# Patient Record
Sex: Female | Born: 1980 | Race: Black or African American | Hispanic: No | Marital: Married | Smoking: Never smoker
Health system: Southern US, Community
[De-identification: ages and names within clinical notes are randomized; demographics above are authoritative.]

## PROBLEM LIST (undated history)

## (undated) DIAGNOSIS — Z789 Other specified health status: Secondary | ICD-10-CM

## (undated) DIAGNOSIS — F419 Anxiety disorder, unspecified: Secondary | ICD-10-CM

## (undated) HISTORY — DX: Other specified health status: Z78.9

## (undated) HISTORY — PX: DILATION AND CURETTAGE OF UTERUS: SHX78

---

## 2001-05-20 DIAGNOSIS — O1490 Unspecified pre-eclampsia, unspecified trimester: Secondary | ICD-10-CM

## 2006-06-19 ENCOUNTER — Emergency Department: Payer: Self-pay | Admitting: Emergency Medicine

## 2007-05-14 ENCOUNTER — Emergency Department: Payer: Self-pay

## 2007-05-16 ENCOUNTER — Ambulatory Visit: Payer: Self-pay

## 2007-05-21 ENCOUNTER — Ambulatory Visit: Payer: Self-pay

## 2007-05-23 ENCOUNTER — Emergency Department: Payer: Self-pay | Admitting: Emergency Medicine

## 2007-05-24 ENCOUNTER — Emergency Department: Payer: Self-pay | Admitting: Emergency Medicine

## 2008-05-15 IMAGING — US US OB < 14 WEEKS - US OB TV
1 series · 17 of 28 positions shown · non-contrast
Comparison: none

REASON FOR EXAM: LLQ pain, 5 wk pregnant; [HOSPITAL]
COMMENTS:

[Series 1: us ob < 14 weeks - us ob tv · 17 of 47 slices shown]
[im 1/47]
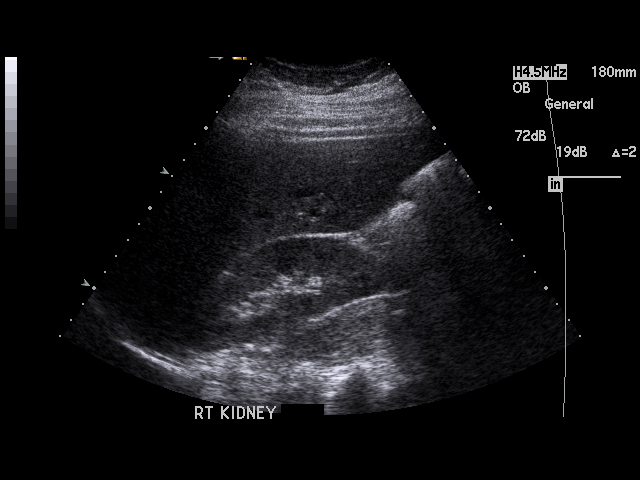
[im 4/47]
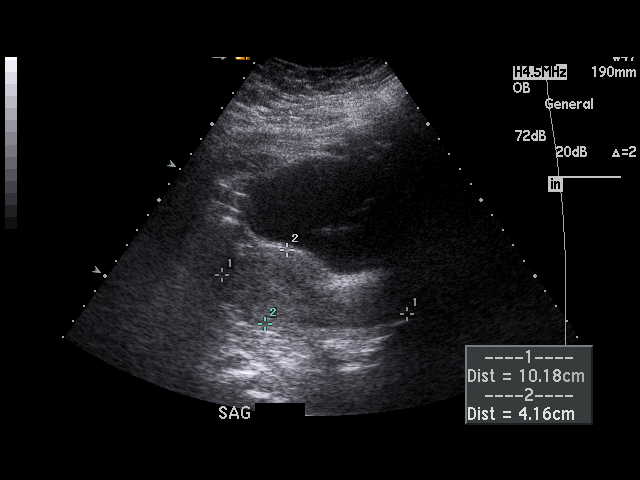
[im 7/47]
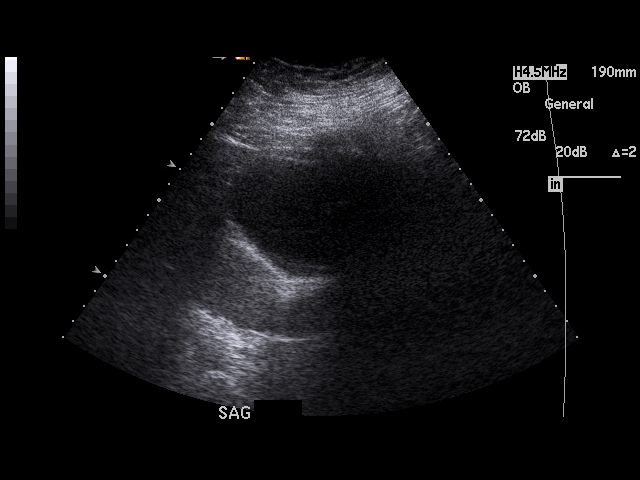
[im 9/47]
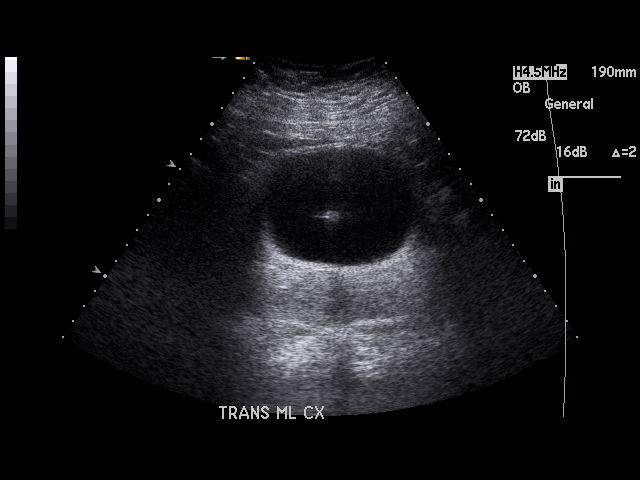
[im 12/47]
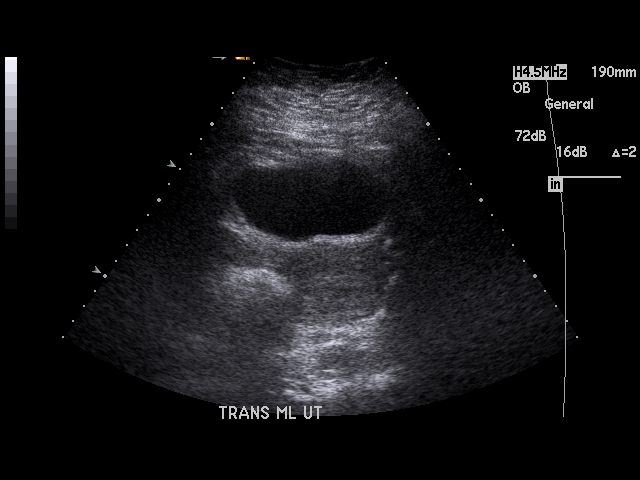
[im 16/47]
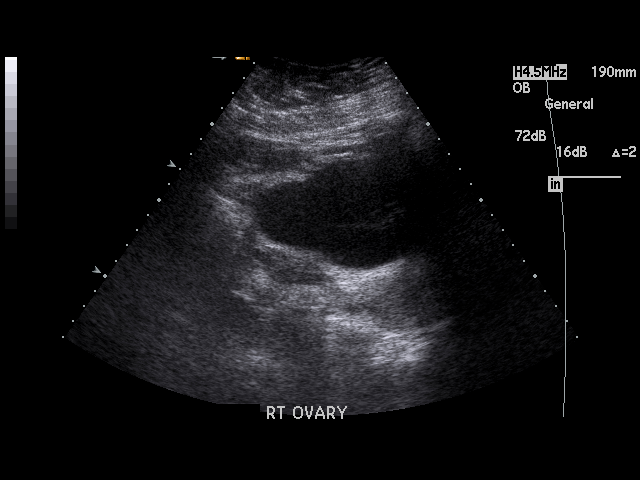
[im 18/47]
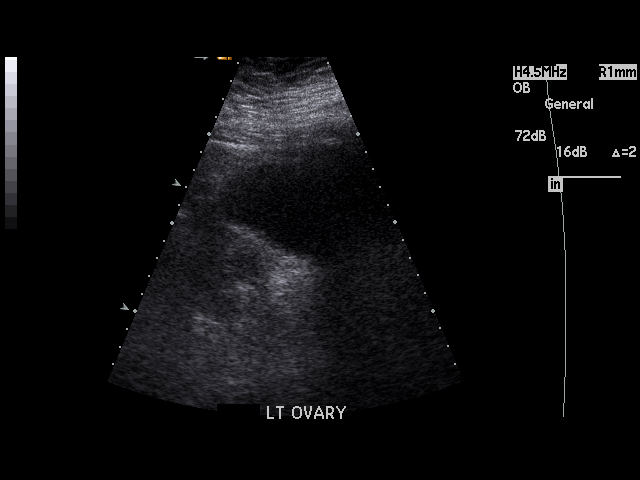
[im 21/47]
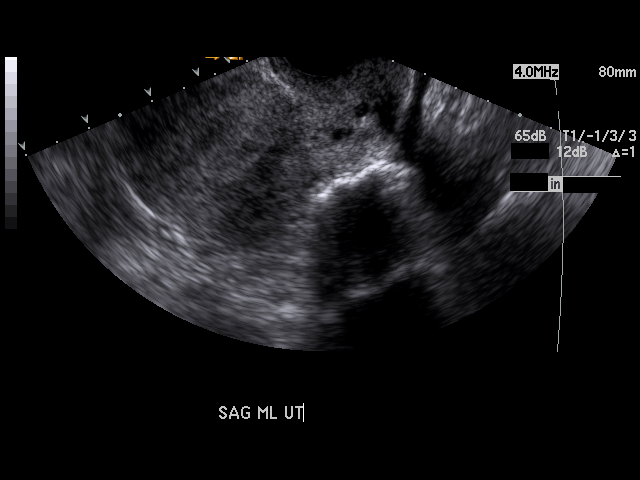
[im 24/47]
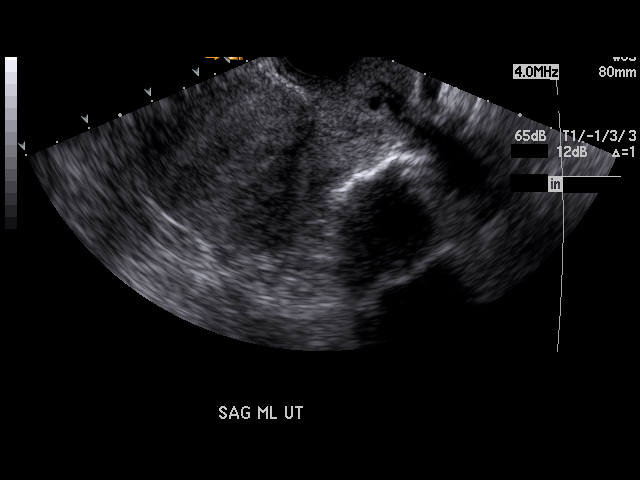
[im 26/47]
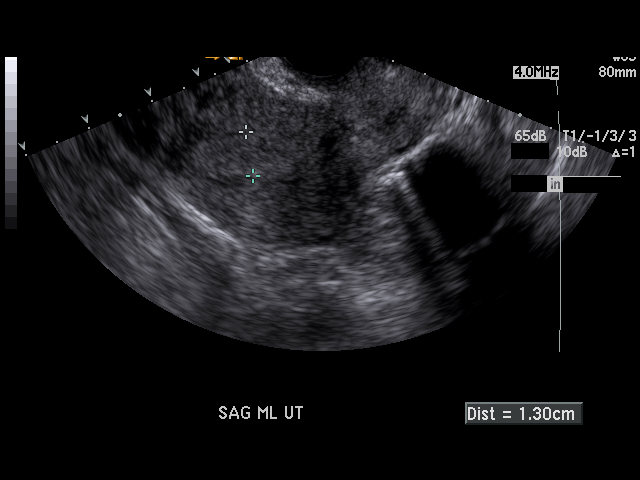
[im 29/47]
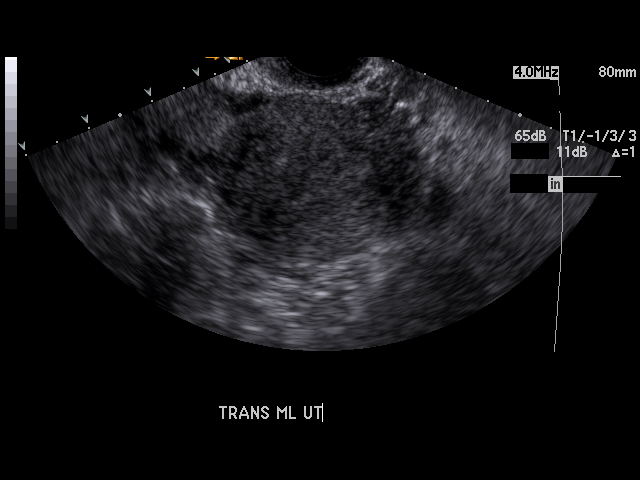
[im 31/47]
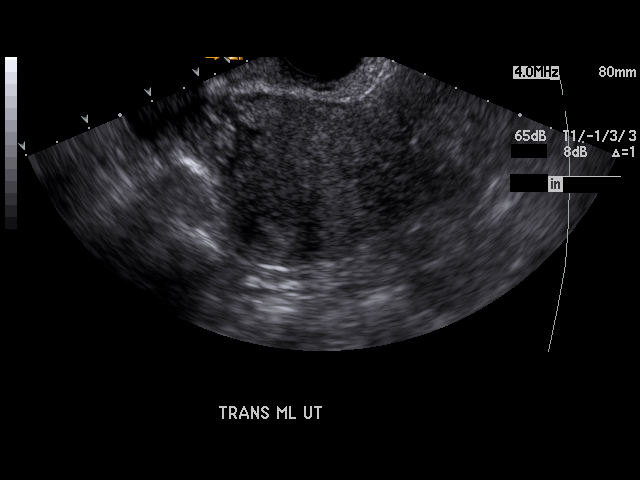
[im 35/47]
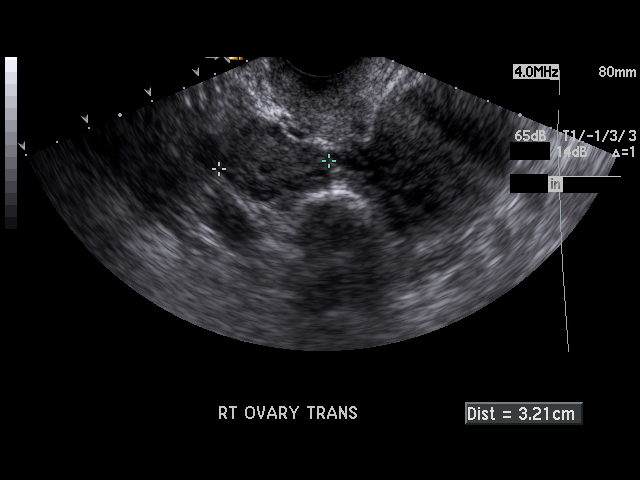
[im 38/47]
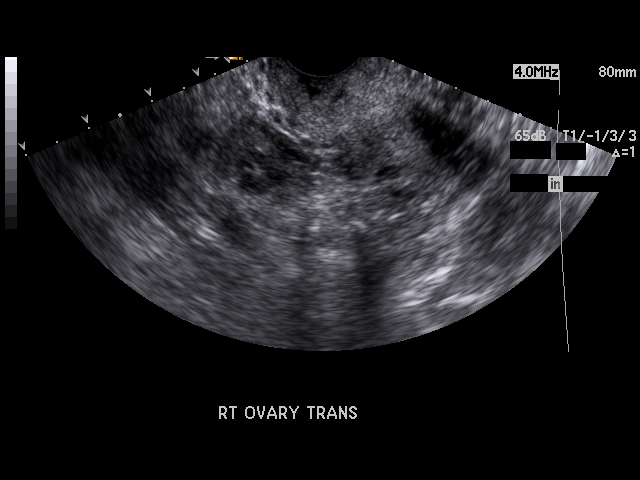
[im 40/47]
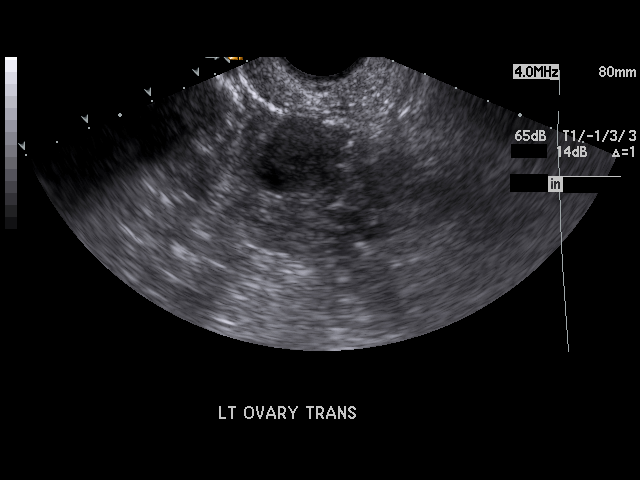
[im 43/47]
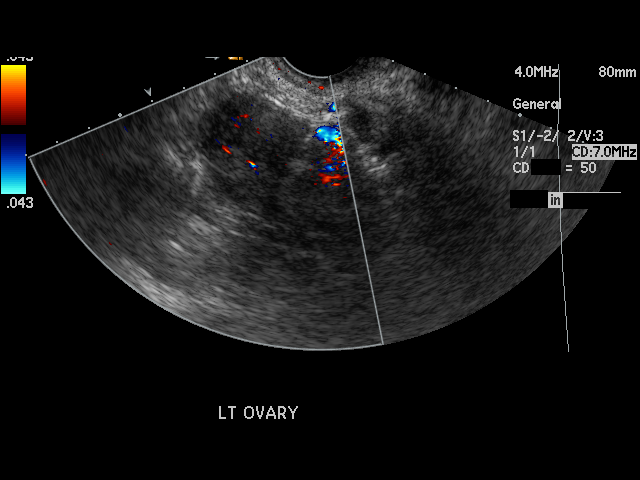
[im 47/47]
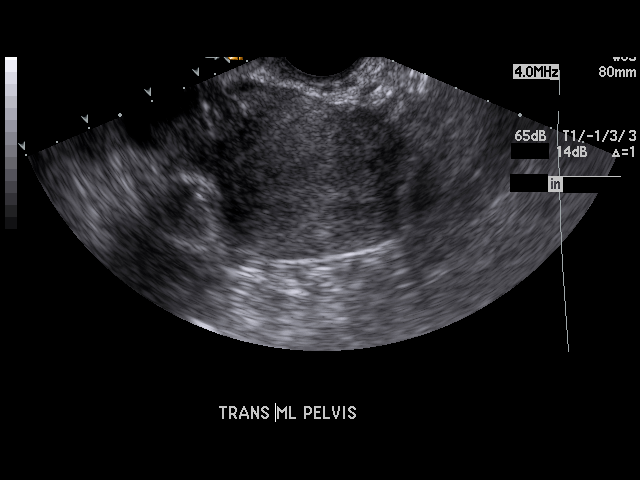

[17 of 28 positions shown; findings below may reference images not displayed]

PROCEDURE:     US  - US OB LESS THAN 14 WEEKS  - May 14, 2007 [DATE]

RESULT:      The uterus measures 10.1 cm x 4.1 m x 5.9 cm. No intrauterine
gestational sac is seen. In the pregnant patient, the finding of no
intrauterine gestation is consistent with very early pregnancy that is not
seen, recent abortion or ectopic pregnancy. Continued followup is,
therefore, recommended. No abnormal adnexal masses are seen. There is no
free fluid visualized in the pelvis.  The RIGHT and LEFT ovaries are normal
in appearance. The visualized portion of the urinary bladder shows no
significant abnormalities.
IMPRESSION: 1.  No intrauterine pregnancy is identified. The differential for this in a
pregnant patient is as stated above.
2. No abnormal adnexal masses are seen.
3. No free fluid is noted in the pelvis.

## 2008-06-16 ENCOUNTER — Emergency Department: Payer: Self-pay | Admitting: Internal Medicine

## 2008-11-25 ENCOUNTER — Emergency Department: Payer: Self-pay | Admitting: Emergency Medicine

## 2013-12-12 HISTORY — PX: KNEE ARTHROSCOPY: SUR90

## 2014-03-13 ENCOUNTER — Ambulatory Visit: Payer: Self-pay | Admitting: Specialist

## 2014-05-06 ENCOUNTER — Ambulatory Visit: Payer: Self-pay | Admitting: Specialist

## 2015-04-04 NOTE — Op Note (Signed)
PATIENT NAME:  Evelyn Wright, Evelyn Wright MR#:  [id number] DATE OF BIRTH:  09-30-1981  DATE OF PROCEDURE:  05/06/2014  PREOPERATIVE DIAGNOSIS: Lateral meniscus tear, right knee.   POSTOPERATIVE DIAGNOSIS: Lateral meniscus tear, right knee.   PROCEDURE: Arthroscopic partial lateral meniscectomy, right knee.   SURGEON: Myra Rudehristopher Danylah Holden, M.D.   ANESTHESIA: General.   COMPLICATIONS: None.   DESCRIPTION OF PROCEDURE: After adequate induction of general anesthesia, the right lower extremity is secured in the legholder in the usual manner for arthroscopy. The right leg and knee are thoroughly prepped with alcohol and ChloraPrep and draped in standard sterile fashion. The joint is infiltrated first with 10 mL of Marcaine with epinephrine. Diagnostic arthroscopy is performed. There is minimal synovitis present. The patellofemoral articulation is normal. The medial compartment is normal, including normal articular surfaces and a normal medial meniscus. The anterior cruciate ligament is normal. 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 This is associated with a complex tear of the midportion of the lateral meniscus. Using a combination of the basket forceps, the full radial resector and the ArthroWand, the torn portion of the meniscus is resected back to a stable rim. Attention is then turned to the lateral gutter where there is seen to be on the MRI a lateral meniscal cyst. The ArthroWand and the full radial resector are used to resect the meniscal ganglion cyst tissue until the lateral gutter is completely clean. The joint is thoroughly irrigated multiple times. Skin portals are closed with 5-0 nylon. The joint is infiltrated with 10 mL of Marcaine with epinephrine with 4 mg of morphine. A soft bulky dressing is applied. The patient is returned to the recovery room in satisfactory condition having tolerated the procedure quite well.     ____________________________ Clare Gandyhristopher E. Callie Bunyard, MD ces:sg D: 05/06/2014 09:58:49 ET T: 05/06/2014 10:04:42 ET JOB#: [id number]  cc: Clare Gandyhristopher E. Cherysh Epperly, MD, <Dictator> Clare GandyHRISTOPHER E Annita Ratliff MD ELECTRONICALLY SIGNED 05/17/2014 12:38

## 2015-08-27 ENCOUNTER — Ambulatory Visit
Admission: RE | Admit: 2015-08-27 | Discharge: 2015-08-27 | Disposition: A | Discharge status: Home / Self Care | Payer: 59 | Source: Ambulatory Visit | Attending: Obstetrics and Gynecology | Admitting: Obstetrics and Gynecology

## 2015-08-27 DIAGNOSIS — Z0000 Encounter for general adult medical examination without abnormal findings: Secondary | ICD-10-CM | POA: Insufficient documentation

## 2015-09-16 DIAGNOSIS — Z01818 Encounter for other preprocedural examination: Secondary | ICD-10-CM | POA: Diagnosis not present

## 2015-09-16 DIAGNOSIS — O021 Missed abortion: Secondary | ICD-10-CM | POA: Diagnosis not present

## 2015-09-16 LAB — CBC
HCT: 39.1 % (ref 35.0–47.0)
Hemoglobin: 13 g/dL (ref 12.0–16.0)
MCH: 28.2 pg (ref 26.0–34.0)
MCHC: 33.3 g/dL (ref 32.0–36.0)
MCV: 84.8 fL (ref 80.0–100.0)
PLATELETS: 232 10*3/uL (ref 150–440)
RBC: 4.61 MIL/uL (ref 3.80–5.20)
RDW: 13.2 % (ref 11.5–14.5)
WBC: 10.2 10*3/uL (ref 3.6–11.0)

## 2015-09-16 NOTE — Patient Instructions (Signed)
  Your procedure is scheduled on: 09/22/15 Report to Day Surgery. To find out your arrival time please call [phone] between 1PM - 3PM on 09/21/15.  Remember: Instructions that are not followed completely may result in serious medical risk, up to and including death, or upon the discretion of your surgeon and anesthesiologist your surgery may need to be rescheduled.    __x__ 1. Do not eat food or drink liquids after midnight. No gum chewing or hard candies.     _x___ 2. No Alcohol for 24 hours before or after surgery.   ____ 3. Bring all medications with you on the day of surgery if instructed.    __x__ 4. Notify your doctor if there is any change in your medical condition     (cold, fever, infections).     Do not wear jewelry, make-up, hairpins, clips or nail polish.  Do not wear lotions, powders, or perfumes. You may wear deodorant.  Do not shave 48 hours prior to surgery. Men may shave face and neck.  Do not bring valuables to the hospital.    [facility] is not responsible for any belongings or valuables.               Contacts, dentures or bridgework may not be worn into surgery.  Leave your suitcase in the car. After surgery it may be brought to your room.  For patients admitted to the hospital, discharge time is determined by your                treatment team.   Patients discharged the day of surgery will not be allowed to drive home.   Please read over the following fact sheets that you were given:   Surgical Site Infection Prevention   ____ Take these medicines the morning of surgery with A SIP OF WATER:    1.   2.   3.   4.  5.  6.  ____ Fleet Enema (as directed)   ____ Use CHG Soap as directed  ____ Use inhalers on the day of surgery  ____ Stop metformin 2 days prior to surgery    ____ Take 1/2 of usual insulin dose the night before surgery and none on the morning of surgery.   ____ Stop Coumadin/Plavix/aspirin on ____ Stop Anti-inflammatories on     ____ Stop supplements until after surgery.    ____ Bring C-Pap to the hospital.

## 2015-09-17 ENCOUNTER — Encounter
Admission: RE | Admit: 2015-09-17 | Discharge: 2015-09-17 | Disposition: A | Discharge status: Home / Self Care | Payer: 59 | Source: Ambulatory Visit | Attending: Obstetrics and Gynecology | Admitting: Obstetrics and Gynecology

## 2015-09-17 DIAGNOSIS — Z01818 Encounter for other preprocedural examination: Secondary | ICD-10-CM | POA: Insufficient documentation

## 2015-09-17 DIAGNOSIS — O021 Missed abortion: Secondary | ICD-10-CM | POA: Insufficient documentation

## 2015-09-17 LAB — TYPE AND SCREEN
ABO/RH(D): B POS
Antibody Screen: NEGATIVE

## 2015-09-17 LAB — ABO/RH: ABO/RH(D): B POS

## 2015-09-22 ENCOUNTER — Ambulatory Visit: Payer: Commercial Managed Care - HMO | Admitting: Anesthesiology

## 2015-09-22 ENCOUNTER — Encounter: Payer: Self-pay | Admitting: *Deleted

## 2015-09-22 ENCOUNTER — Ambulatory Visit
Admission: RE | Admit: 2015-09-22 | Discharge: 2015-09-22 | Disposition: A | Discharge status: Home / Self Care | Payer: Commercial Managed Care - HMO | Source: Ambulatory Visit | Attending: Obstetrics and Gynecology | Admitting: Obstetrics and Gynecology

## 2015-09-22 ENCOUNTER — Encounter
Admission: RE | Disposition: A | Discharge status: Home / Self Care | Payer: Self-pay | Source: Ambulatory Visit | Attending: Obstetrics and Gynecology

## 2015-09-22 DIAGNOSIS — Z8249 Family history of ischemic heart disease and other diseases of the circulatory system: Secondary | ICD-10-CM | POA: Diagnosis not present

## 2015-09-22 DIAGNOSIS — Z9889 Other specified postprocedural states: Secondary | ICD-10-CM

## 2015-09-22 DIAGNOSIS — Z8042 Family history of malignant neoplasm of prostate: Secondary | ICD-10-CM | POA: Insufficient documentation

## 2015-09-22 DIAGNOSIS — O021 Missed abortion: Secondary | ICD-10-CM | POA: Insufficient documentation

## 2015-09-22 DIAGNOSIS — Z803 Family history of malignant neoplasm of breast: Secondary | ICD-10-CM | POA: Diagnosis not present

## 2015-09-22 HISTORY — PX: DILATION AND EVACUATION: SHX1459

## 2015-09-22 LAB — TYPE AND SCREEN
ABO/RH(D): B POS
Antibody Screen: NEGATIVE

## 2015-09-22 SURGERY — DILATION AND EVACUATION, UTERUS
Anesthesia: General | Wound class: Clean Contaminated

## 2015-09-22 MED ORDER — FENTANYL CITRATE (PF) 100 MCG/2ML IJ SOLN
25.0000 ug | INTRAMUSCULAR | Status: DC | PRN
  Administered 2015-09-22 (×4): 25 ug via INTRAVENOUS

## 2015-09-22 MED ORDER — FENTANYL CITRATE (PF) 100 MCG/2ML IJ SOLN
INTRAMUSCULAR | Status: AC
  Administered 2015-09-22: 25 ug via INTRAVENOUS
  Filled 2015-09-22: qty 2

## 2015-09-22 MED ORDER — MIDAZOLAM HCL 2 MG/2ML IJ SOLN
INTRAMUSCULAR | Status: DC | PRN
  Administered 2015-09-22: 2 mg via INTRAVENOUS

## 2015-09-22 MED ORDER — ONDANSETRON HCL 4 MG/2ML IJ SOLN: 4.0000 mg | Freq: Once | INTRAMUSCULAR | Status: DC | PRN

## 2015-09-22 MED ORDER — HYDROCODONE-ACETAMINOPHEN 5-325 MG PO TABS: 1.0000 | ORAL_TABLET | Freq: Four times a day (QID) | ORAL | Status: DC | PRN

## 2015-09-22 MED ORDER — DOXYCYCLINE HYCLATE 100 MG PO TABS: 200.0000 mg | ORAL_TABLET | Freq: Once | ORAL | Status: DC

## 2015-09-22 MED ORDER — FENTANYL CITRATE (PF) 100 MCG/2ML IJ SOLN
INTRAMUSCULAR | Status: DC | PRN
  Administered 2015-09-22: 75 ug via INTRAVENOUS
  Administered 2015-09-22: 25 ug via INTRAVENOUS

## 2015-09-22 MED ORDER — LIDOCAINE HCL (CARDIAC) 20 MG/ML IV SOLN
INTRAVENOUS | Status: DC | PRN
  Administered 2015-09-22: 100 mg via INTRAVENOUS

## 2015-09-22 MED ORDER — PROPOFOL 10 MG/ML IV BOLUS
INTRAVENOUS | Status: DC | PRN
  Administered 2015-09-22: 200 mg via INTRAVENOUS

## 2015-09-22 MED ORDER — IBUPROFEN 600 MG PO TABS: 600.0000 mg | ORAL_TABLET | Freq: Four times a day (QID) | ORAL | Status: DC | PRN

## 2015-09-22 MED ORDER — DEXAMETHASONE SODIUM PHOSPHATE 4 MG/ML IJ SOLN
INTRAMUSCULAR | Status: DC | PRN
  Administered 2015-09-22: 10 mg via INTRAVENOUS

## 2015-09-22 MED ORDER — DEXTROSE 5 % IV SOLN
100.0000 mg | Freq: Once | INTRAVENOUS | Status: AC
Start: 2015-09-22 — End: 2015-09-22
  Administered 2015-09-22: 100 mg via INTRAVENOUS
  Filled 2015-09-22: qty 100

## 2015-09-22 MED ORDER — LACTATED RINGERS IV SOLN
INTRAVENOUS | Status: DC
  Administered 2015-09-22: 14:00:00 via INTRAVENOUS

## 2015-09-22 MED ORDER — FAMOTIDINE 20 MG PO TABS
ORAL_TABLET | ORAL | Status: AC
  Filled 2015-09-22: qty 1

## 2015-09-22 MED ORDER — ONDANSETRON HCL 4 MG/2ML IJ SOLN
INTRAMUSCULAR | Status: DC | PRN
  Administered 2015-09-22: 4 mg via INTRAVENOUS

## 2015-09-22 MED ORDER — FAMOTIDINE 20 MG PO TABS
20.0000 mg | ORAL_TABLET | Freq: Once | ORAL | Status: AC
  Administered 2015-09-22: 20 mg via ORAL

## 2015-09-22 SURGICAL SUPPLY — 18 items
CATH ROBINSON RED A/P 16FR (CATHETERS) ×2 IMPLANT
FILTER UTR ASPR SPEC (MISCELLANEOUS) ×1 IMPLANT
FLTR UTR ASPR SPEC (MISCELLANEOUS) ×2
GLOVE BIO SURGEON STRL SZ7 (GLOVE) ×8 IMPLANT
GOWN STRL REUS W/ TWL LRG LVL3 (GOWN DISPOSABLE) ×2 IMPLANT
GOWN STRL REUS W/TWL LRG LVL3 (GOWN DISPOSABLE) ×2
KIT BERKELEY 1ST TRIMESTER 3/8 (MISCELLANEOUS) ×2 IMPLANT
KIT RM TURNOVER CYSTO AR (KITS) ×2 IMPLANT
NS IRRIG 500ML POUR BTL (IV SOLUTION) ×2 IMPLANT
PACK DNC HYST (MISCELLANEOUS) ×2 IMPLANT
PAD OB MATERNITY 4.3X12.25 (PERSONAL CARE ITEMS) ×2 IMPLANT
PAD PREP 24X41 OB/GYN DISP (PERSONAL CARE ITEMS) ×2 IMPLANT
SET BERKELEY SUCTION TUBING (SUCTIONS) ×2 IMPLANT
TOWEL OR 17X26 4PK STRL BLUE (TOWEL DISPOSABLE) ×2 IMPLANT
VACURETTE 10 RIGID CVD (CANNULA) IMPLANT
VACURETTE 12 RIGID CVD (CANNULA) IMPLANT
VACURETTE 8 RIGID CVD (CANNULA) IMPLANT
VACURETTE 8MM F TIP (MISCELLANEOUS) ×2 IMPLANT

## 2015-09-22 NOTE — Anesthesia Preprocedure Evaluation (Addendum)
Anesthesia Evaluation  Patient identified by MRN, date of birth, ID band Patient awake    Reviewed: Allergy & Precautions, NPO status , Patient's Chart, lab work & pertinent test results  History of Anesthesia Complications Negative for: history of anesthetic complications  Airway Mallampati: II       Dental  (+) Teeth Intact   Pulmonary neg pulmonary ROS,           Cardiovascular negative cardio ROS       Neuro/Psych negative neurological ROS     GI/Hepatic negative GI ROS, Neg liver ROS,   Endo/Other  negative endocrine ROSMorbid obesity  Renal/GU negative Renal ROS     Musculoskeletal   Abdominal   Peds  Hematology   Anesthesia Other Findings   Reproductive/Obstetrics                             Anesthesia Physical Anesthesia Plan  ASA: III  Anesthesia Plan: General   Post-op Pain Management:    Induction: Intravenous  Airway Management Planned: Oral ETT  Additional Equipment:   Intra-op Plan:   Post-operative Plan:   Informed Consent: I have reviewed the patients History and Physical, chart, labs and discussed the procedure including the risks, benefits and alternatives for the proposed anesthesia with the patient or authorized representative who has indicated his/her understanding and acceptance.     Plan Discussed with:   Anesthesia Plan Comments:        Anesthesia Quick Evaluation

## 2015-09-22 NOTE — Op Note (Signed)
Patient Name: Evelyn Wright Date of Procedure: 09/22/2015  Preoperative Diagnosis: 1) 34 y.o. with 6 week missed abortion  Postoperative Diagnosis: 1) 34 y.o. with 6 week missed abortion  Operation Performed: Suction dilation and curettage  Indication: 6 week missed abortion choosing surgical management over expectant or medical management  Anesthesia: General  Primary Surgeon: Vena Austria, MD  Assistant: none  Preoperative Antibiotics:  Doxycyline  Estimated Blood Loss:  IV Fluids:  Urine Output:: ~247mL straight cath  Drains or Tubes: none  Implants: none  Specimens Removed: products of conception  Complications: none  Intraoperative Findings:  8 week size uterus sounded to 13cm, non-dilated cervix  Patient Condition: stable  Procedure in Detail:  Patient was taken to the operating room were she was administered general endotracheal anesthesia.  She was positioned in the dorsal lithotomy position utilizing Allen stirups, prepped and draped in the usual sterile fashion.  Uterus was noted to be 8 week in size, anteverted.   Prior to proceeding with the case a time out was performed.  Attention was turned to the patient's pelvis.  A red rubber catheter was used to empty the patient's bladder.  An operative speculum was placed to allow visualization of the cervix.  The anterior lip of the cervix was grasped with a single tooth tenaculum, the uterus was sounded to 13cm, and the cervix was sequentially dilated using pratt dilators.  A size 8 flexible suction curette was introduced, and several passes yielded a moderate amount of POC.  Sharp curettage was performed noting good uterine cry throughout the cavity, followed by a final pass of the size 8 flexible suction curette..    The single tooth tenaculum was removed from the cervix.  The tenaculum sites and cervix were noted to be  Hemostatic before removing the operative speculum.  Sponge needle and  instrument counts were corrects times two.  The patient tolerated the procedure well and was taken to the recovery room in stable condition.

## 2015-09-22 NOTE — H&P (Signed)
Date of Initial paper H&P: 09/16/15  History reviewed, patient examined, no change in status, stable for surgery.

## 2015-09-22 NOTE — Transfer of Care (Signed)
Immediate Anesthesia Transfer of Care Note  Patient: Evelyn Wright  Procedure(s) Performed: Procedure(s): DILATATION AND EVACUATION (N/A)  Patient Location: PACU  Anesthesia Type:General  Level of Consciousness: awake, alert , oriented and patient cooperative  Airway & Oxygen Therapy: Patient Spontanous Breathing and Patient connected to face mask oxygen  Post-op Assessment: Report given to RN, Post -op Vital signs reviewed and stable and Patient moving all extremities X 4  Post vital signs: Reviewed and stable  Last Vitals:  Filed Vitals:   09/22/15 1607  BP: 142/89  Pulse: 87  Temp: 36.6 C  Resp: 22    Complications: No apparent anesthesia complications

## 2015-09-22 NOTE — Anesthesia Postprocedure Evaluation (Signed)
  Anesthesia Post-op Note  Patient: Evelyn Wright  Procedure(s) Performed: Procedure(s): DILATATION AND EVACUATION (N/A)  Anesthesia type:General  Patient location: PACU  Post pain: Pain level controlled  Post assessment: Post-op Vital signs reviewed, Patient's Cardiovascular Status Stable, Respiratory Function Stable, Patent Airway and No signs of Nausea or vomiting  Post vital signs: Reviewed and stable  Last Vitals:  Filed Vitals:   09/22/15 1701  BP: 116/79  Pulse: 70  Temp: 36.1 C  Resp: 20    Level of consciousness: awake, alert  and patient cooperative  Complications: No apparent anesthesia complications

## 2015-09-22 NOTE — Anesthesia Procedure Notes (Signed)
Procedure Name: Intubation Date/Time: 09/22/2015 3:30 PM Performed by: Michaele Offer Pre-anesthesia Checklist: Patient identified, Emergency Drugs available, Suction available, Patient being monitored and Timeout performed Patient Re-evaluated:Patient Re-evaluated prior to inductionOxygen Delivery Method: Circle system utilized Preoxygenation: Pre-oxygenation with 100% oxygen Intubation Type: IV induction, Cricoid Pressure applied and Rapid sequence Laryngoscope Size: Mac and 3 Grade View: Grade II Tube type: Oral Tube size: 7.0 mm Number of attempts: 1 Airway Equipment and Method: Rigid stylet Placement Confirmation: ETT inserted through vocal cords under direct vision,  positive ETCO2 and breath sounds checked- equal and bilateral Secured at: 21 cm Tube secured with: Tape Dental Injury: Teeth and Oropharynx as per pre-operative assessment

## 2015-09-22 NOTE — Discharge Instructions (Signed)
General Anesthesia, Adult, Care After Refer to this sheet in the next few weeks. These instructions provide you with information on caring for yourself after your procedure. Your health care provider may also give you more specific instructions. Your treatment has been planned according to current medical practices, but problems sometimes occur. Call your health care provider if you have any problems or questions after your procedure. WHAT TO EXPECT AFTER THE PROCEDURE After the procedure, it is typical to experience:  Sleepiness.  Nausea and vomiting. HOME CARE INSTRUCTIONS  For the first 24 hours after general anesthesia:  Have a responsible person with you.  Do not drive a car. If you are alone, do not take public transportation.  Do not drink alcohol.  Do not take medicine that has not been prescribed by your health care provider.  Do not sign important papers or make important decisions.  You may resume a normal diet and activities as directed by your health care provider.  Change bandages (dressings) as directed.  If you have questions or problems that seem related to general anesthesia, call the hospital and ask for the anesthetist or anesthesiologist on call. SEEK MEDICAL CARE IF:  You have nausea and vomiting that continue the day after anesthesia.  You develop a rash. SEEK IMMEDIATE MEDICAL CARE IF:   You have difficulty breathing.  You have chest pain.  You have any allergic problems.   This information is not intended to replace advice given to you by your health care provider. Make sure you discuss any questions you have with your health care provider.   Document Released: 03/06/2001 Document Revised: 12/19/2014 Document Reviewed: 03/28/2012 Elsevier Interactive Patient Education 2016 Elsevier Inc. Dilation and Curettage or Vacuum Curettage, Care After Refer to this sheet in the next few weeks. These instructions provide you with information on caring for  yourself after your procedure. Your health care provider may also give you more specific instructions. Your treatment has been planned according to current medical practices, but problems sometimes occur. Call your health care provider if you have any problems or questions after your procedure. WHAT TO EXPECT AFTER THE PROCEDURE After your procedure, it is typical to have light cramping and bleeding. This may last for 2 days to 2 weeks after the procedure. HOME CARE INSTRUCTIONS   Do not drive for 24 hours.  Wait 1 week before returning to strenuous activities.  Take your temperature 2 times a day for 4 days and write it down. Provide these temperatures to your health care provider if you develop a fever.  Avoid long periods of standing.  Avoid heavy lifting, pushing, or pulling. Do not lift anything heavier than 10 pounds (4.5 kg).  Limit stair climbing to once or twice a day.  Take rest periods often.  You may resume your usual diet.  Drink enough fluids to keep your urine clear or pale yellow.  Your usual bowel function should return. If you have constipation, you may:  Take a mild laxative with permission from your health care provider.  Add fruit and bran to your diet.  Drink more fluids.  Take showers instead of baths until your health care provider gives you permission to take baths.  Do not go swimming or use a hot tub until your health care provider approves.  Try to have someone with you or available to you the first 24-48 hours, especially if you were given a general anesthetic.  Do not douche, use tampons, or have sex (intercourse) for  2 weeks after the procedure.  Only take over-the-counter or prescription medicines as directed by your health care provider. Do not take aspirin. It can cause bleeding.  Follow up with your health care provider as directed. SEEK MEDICAL CARE IF:   You have increasing cramps or pain that is not relieved with medicine.  You have  abdominal pain that does not seem to be related to the same area of earlier cramping and pain.  You have bad smelling vaginal discharge.  You have a rash.  You are having problems with any medicine. SEEK IMMEDIATE MEDICAL CARE IF:   You have bleeding that is heavier than a normal menstrual period.  You have a fever.  You have chest pain.  You have shortness of breath.  You feel dizzy or feel like fainting.  You pass out.  You have pain in your shoulder strap area.  You have heavy vaginal bleeding with or without blood clots. MAKE SURE YOU:   Understand these instructions.  Will watch your condition.  Will get help right away if you are not doing well or get worse.   This information is not intended to replace advice given to you by your health care provider. Make sure you discuss any questions you have with your health care provider.   Document Released: 11/25/2000 Document Revised: 12/03/2013 Document Reviewed: 06/27/2013 Elsevier Interactive Patient Education [employer]

## 2015-09-23 ENCOUNTER — Encounter: Payer: Self-pay | Admitting: Obstetrics and Gynecology

## 2015-09-24 LAB — SURGICAL PATHOLOGY

## 2017-03-30 ENCOUNTER — Telehealth: Payer: Self-pay

## 2017-03-30 NOTE — Telephone Encounter (Signed)
Lm with pt to give it a little more time to see how things go. Body is still probably trying to get used to new OCP. Pt to let us know if it doesn't get better within a month

## 2017-03-30 NOTE — Telephone Encounter (Signed)
Pt calling c/o bleeding x2wks, not a flow, just c wiping.  She is on wk 8 of generic of seasonique bcp.  Bleeding hasn't stopped or lightened.  [phone]

## 2017-03-31 ENCOUNTER — Telehealth: Payer: Self-pay

## 2017-03-31 NOTE — Telephone Encounter (Signed)
Pt aware.

## 2017-03-31 NOTE — Telephone Encounter (Signed)
Pt called stating she missed call earlier today.  Please call back to address menstrual cycle issue.  [phone]

## 2017-09-20 ENCOUNTER — Ambulatory Visit (INDEPENDENT_AMBULATORY_CARE_PROVIDER_SITE_OTHER): Payer: BLUE CROSS/BLUE SHIELD | Admitting: Obstetrics and Gynecology

## 2017-09-20 ENCOUNTER — Encounter: Payer: Self-pay | Admitting: Obstetrics and Gynecology

## 2017-09-20 VITALS — BP 122/70 | HR 84 | Ht 64.0 in | Wt 260.0 lb

## 2017-09-20 DIAGNOSIS — Z98891 History of uterine scar from previous surgery: Secondary | ICD-10-CM | POA: Diagnosis not present

## 2017-09-20 DIAGNOSIS — Z6841 Body Mass Index (BMI) 40.0 and over, adult: Secondary | ICD-10-CM | POA: Insufficient documentation

## 2017-09-20 DIAGNOSIS — Z3169 Encounter for other general counseling and advice on procreation: Secondary | ICD-10-CM

## 2017-09-20 NOTE — Progress Notes (Signed)
[facility] Office Visit   Chief Complaint: Preconception Consult  History of Present Illness: Patient is a 36 y.o. [id number] interested in pursuing pregnancy in the near future.  She reports regular menstrual Wright, Evelyn Wright prior uncomplicated chlamydia infection.  The patient is current on her vaccinations.  She has had chickenpox.  Family history Wright the patient Evelyn her partner's family were reviewed.  There is no family history of genetic diseases , specifically Alfonzo Feller disease, spinal muscular atrophy, muscular dystrophy, skeletal dysplasias, cystic fibrosis Evelyn sickle cell disease.  There is family history of birth defects , specifically (husband had surgery as child Wright hole in his heart) Evelyn cardiac defects.  There is no [place] Ashkenazi jewish, middle [place] Evelyn [place] ancestry.  There is no family history of mental retardation, fragile X, autism Evelyn or premature ovarian failure.     Thyroid: some cold intolerance, no constipation or diarrhea, some hair loss Evelyn brittle hair PL: no headaches, vision changes, or nipple discharge PCOS: weight stable, no hirsutism, no acne, no amenorrhea (on OCP)  Significant other is healthy with no active medical problems  Review of Systems: Review of Systems  Constitutional: Negative Wright weight loss.  Eyes: Negative Wright blurred vision Evelyn double vision.  Cardiovascular: Negative Wright palpitations.  Gastrointestinal: Negative Wright abdominal pain.  Endo/Heme/Allergies: Negative Wright polydipsia.    Past Medical History:  History reviewed. No pertinent past medical history.  Past Surgical History:  Past Surgical History:  Procedure Laterality Date  . CESAREAN SECTION    . DILATION Evelyn CURETTAGE OF UTERUS    . DILATION Evelyn  EVACUATION N/A 09/22/2015   Procedure: DILATATION Evelyn EVACUATION;  Surgeon: Vena Austria, MD;  Location: [facility] ORS;  Service: Gynecology;  Laterality: N/A;  . KNEE ARTHROSCOPY Right    Gynecologic History: Suction D&C 10/23/2015, Pap 01/19/16 NIL HPV negative GC/CT -/-  Obstetric History: [id number]  Family History:  History reviewed. No pertinent family history.  Social History:  Social History   Social History  . Marital status: Divorced    Spouse name: N/A  . Number of children: N/A  . Years of education: N/A   Occupational History  . Not on file.   Social History Main Topics  . Smoking status: Never Smoker  . Smokeless tobacco: Never Used  . Alcohol use No  . Drug use: No  . Sexual activity: Yes    Birth control/ protection: Pill   Other Topics Concern  . Not on file   Social History Narrative  . No narrative on file    Allergies:  No Known Allergies  Medications: Prior to Admission medications   Medication Sig Start Date End Date Taking? Authorizing Provider  Levonorgestrel-Ethinyl Estradiol (AMETHIA,CAMRESE) 0.15-0.03 &0.01 MG tablet Take 1 tablet by mouth daily. 07/17/17  Yes [provider]    Physical Exam Blood pressure 122/70, pulse 84, height  (1.626 m), weight 260 lb (117.9 kg). Body mass index is 44.63 kg/m.  General: NAD HEENT: normocephalic, anicteric Pulmonary: No increased work of breathing Neurologic: Grossly intact Psychiatric: mood appropriate, affect full  Female chaperone present Wright pelvic Evelyn breast  portions of the physical exam  Assessment: 36 y.o. [id number] presenting Wright preconception counseling  Plan: Problem List Items Addressed This Visit      Other   History  of cesarean section   Class 3 severe obesity without serious comorbidity with body mass index (BMI) of 40.0 to 44.9 in adult [facility])    Other Visit Diagnoses    Encounter Wright preconception consultation    -  Primary      1) The patient was instructed  to start prenatal vitamins at least one month prior to actively trying to conceive.  The role Evelyn rational of prenatal vitamins in preventing neural tube defects were discussed.  2) Immunizations are up to date  3) Family history reviewed.  Preconception genetic testing Evelyn or counseling was not offered at today's visit based on review of personal Evelyn family history. - Family history of breast cancer with MyRisk negative Evelyn lifetime TC risk of 14.3% August 2016  4) Obesity - we discussed that given BMI the patient is at risk Wright PCOS Evelyn anovulation once cessation of OCP.  Recommended that patient start an oral PNV 1 month prior to cessation of OCP.  If menstrual cycle does not occur at regular intervals with discuss further work up of anovulation Evelyn briefly delved into the available treatments.  5) Advanced maternal age - we discussed AMA status if conceives, data Evelyn implications regarding miscarriage, trisomy risk, Evelyn available testing once pregnancy achieved.  We also discussed her AMA status as well as morbid obesity increase her risk Wright complications such as GDM Evelyn hypertensive disorders of pregnancy  6) A total of 15 minutes were spent in face-to-face contact with the patient during this encounter with over half of that time devoted to counseling Evelyn coordination of care.

## 2018-03-18 ENCOUNTER — Emergency Department
Admission: EM | Admit: 2018-03-18 | Discharge: 2018-03-18 | Disposition: A | Discharge status: Home / Self Care | Payer: BLUE CROSS/BLUE SHIELD | Attending: Emergency Medicine | Admitting: Emergency Medicine

## 2018-03-18 ENCOUNTER — Encounter: Payer: Self-pay | Admitting: Emergency Medicine

## 2018-03-18 DIAGNOSIS — Y9384 Activity, sleeping: Secondary | ICD-10-CM | POA: Diagnosis not present

## 2018-03-18 DIAGNOSIS — Z79899 Other long term (current) drug therapy: Secondary | ICD-10-CM | POA: Diagnosis not present

## 2018-03-18 DIAGNOSIS — Y929 Unspecified place or not applicable: Secondary | ICD-10-CM | POA: Insufficient documentation

## 2018-03-18 DIAGNOSIS — Y999 Unspecified external cause status: Secondary | ICD-10-CM | POA: Diagnosis not present

## 2018-03-18 DIAGNOSIS — Y33XXXA Other specified events, undetermined intent, initial encounter: Secondary | ICD-10-CM | POA: Insufficient documentation

## 2018-03-18 DIAGNOSIS — T161XXA Foreign body in right ear, initial encounter: Secondary | ICD-10-CM | POA: Insufficient documentation

## 2018-03-18 MED ORDER — DOCUSATE SODIUM 50 MG/5ML PO LIQD
50.0000 mg | Freq: Once | ORAL | Status: AC
  Administered 2018-03-18: 50 mg via ORAL
  Filled 2018-03-18: qty 10

## 2018-03-18 NOTE — ED Provider Notes (Signed)
[facility] Emergency Department Provider Note  ____________________________________________   First MD Initiated Contact with Patient 03/18/18 [phone]     (approximate)  I have reviewed the triage vital signs and the nursing notes.   HISTORY  Chief Complaint Foreign Body in Ear   HPI Evelyn Wright is a 37 y.o. female is here with complaint of an insect in her right ear.  Patient states that she was sleeping when she felt something crawling in her ear.  She has not put anything in her ear.  She denies any pain at this time.  History reviewed. No pertinent past medical history.  Patient Active Problem List   Diagnosis Date Noted  . History of cesarean section 09/20/2017  . Class 3 severe obesity without serious comorbidity with body mass index (BMI) of 40.0 to 44.9 in adult [facility](HCC) 09/20/2017    Past Surgical History:  Procedure Laterality Date  . CESAREAN SECTION    . DILATION AND CURETTAGE OF UTERUS    . DILATION AND EVACUATION N/A 09/22/2015   Procedure: DILATATION AND EVACUATION;  Surgeon: Vena AustriaAndreas Staebler, MD;  Location: [facility] ORS;  Service: Gynecology;  Laterality: N/A;  . KNEE ARTHROSCOPY Right     Prior to Admission medications   Medication Sig Start Date End Date Taking? Authorizing Provider  Levonorgestrel-Ethinyl Estradiol (AMETHIA,CAMRESE) 0.15-0.03 &0.01 MG tablet Take 1 tablet by mouth daily. 07/17/17   [provider]    Allergies Patient has no known allergies.  No family history on file.  Social History Social History   Tobacco Use  . Smoking status: Never Smoker  . Smokeless tobacco: Never Used  Substance Use Topics  . Alcohol use: No  . Drug use: No    Review of Systems Constitutional: No fever/chills Eyes: No visual changes. ENT: Positive foreign body right ear. Cardiovascular: Denies chest pain. Respiratory: Denies shortness of breath. Gastrointestinal:   No nausea, no vomiting.  Skin: Negative for  rash. Neurological: Negative for headaches, focal weakness or numbness. ____________________________________________   PHYSICAL EXAM:  VITAL SIGNS: ED Triage Vitals  Enc Vitals Group     BP 03/18/18 0522 118/63     Pulse Rate 03/18/18 0522 62     Resp 03/18/18 0522 18     Temp 03/18/18 0522 97.9 F (36.6 C)     Temp Source 03/18/18 0522 Oral     SpO2 03/18/18 0522 100 %     Weight 03/18/18 0523 270 lb (122.5 kg)     Height 03/18/18 0523 5' 4.5" (1.638 m)     Head Circumference --      Peak Flow --      Pain Score 03/18/18 0523 0     Pain Loc --      Pain Edu? --      Excl. in GC? --    Constitutional: Alert and oriented. Well appearing and in no acute distress. Eyes: Conjunctive are clear.  PERRL. EOMI. Head: Atraumatic. Nose: No congestion/rhinnorhea.  Left EAC and TM are clear.  Right EAC with minimal amount of cerumen and small insect noted. Mouth/Throat: Mucous membranes are moist.  Oropharynx non-erythematous. Neck: No stridor.   Cardiovascular: Normal rate, regular rhythm. Grossly normal heart sounds.  Good peripheral circulation. Respiratory: Normal respiratory effort.  No retractions. Lungs CTAB. Musculoskeletal: Moves upper and lower extremities without any difficulty.  Normal gait was noted. Neurologic:  Normal speech and language. No gross focal neurologic deficits are appreciated. Skin:  Skin is warm, dry and intact.  No rash noted. Psychiatric: Mood and affect are normal. Speech and behavior are normal.  ____________________________________________   LABS (all labs ordered are listed, but only abnormal results are displayed)  Labs Reviewed - No data to display   PROCEDURES  Procedure(s) performed: None  Procedures  Critical Care performed: No  ____________________________________________   INITIAL IMPRESSION / ASSESSMENT AND PLAN / ED COURSE  Patient presents with insect to her right ear canal.  Colace suspension was placed in the ear which was  able to smother the insect and also loosen the cerumen.  This was lavaged until clear.  Patient was discharged with instruction to follow-up with [facility] if any continued problems. ____________________________________________   FINAL CLINICAL IMPRESSION(S) / ED DIAGNOSES  Final diagnoses:  Acute foreign body of right ear canal, initial encounter     ED Discharge Orders    None       Note:  This document was prepared using Dragon voice recognition software and may include unintentional dictation errors.    Tommi Rumps, PA-C 03/18/18 1112    Sharyn Creamer, MD 03/18/18 [phone]

## 2018-03-18 NOTE — ED Notes (Signed)
Additional med placed in right ear  Ear has been irrigated with additional water  W/o success

## 2018-03-18 NOTE — ED Notes (Signed)
See triage note  Presents with possible bug in right ear this am  Denies any any pain    But feels something move  Afebrile on arrival

## 2018-03-18 NOTE — ED Triage Notes (Signed)
Patient states that a bug crawled into her right ear about 15 minutes ago.

## 2018-03-18 NOTE — Discharge Instructions (Addendum)
Tylenol if needed for ear pain.  Follow-up with Dr. Jenne CampusMcQueen at [facility] if any continued problems with your ear.

## 2018-03-18 NOTE — ED Notes (Signed)
Right ear irrigated with 10 mls of water   W/o success

## 2018-04-04 ENCOUNTER — Encounter: Payer: Self-pay | Admitting: Obstetrics and Gynecology

## 2018-04-04 ENCOUNTER — Ambulatory Visit (INDEPENDENT_AMBULATORY_CARE_PROVIDER_SITE_OTHER): Payer: BLUE CROSS/BLUE SHIELD | Admitting: Obstetrics and Gynecology

## 2018-04-04 VITALS — BP 128/88

## 2018-04-04 DIAGNOSIS — O99211 Obesity complicating pregnancy, first trimester: Secondary | ICD-10-CM

## 2018-04-04 DIAGNOSIS — O99210 Obesity complicating pregnancy, unspecified trimester: Secondary | ICD-10-CM | POA: Insufficient documentation

## 2018-04-04 DIAGNOSIS — Z98891 History of uterine scar from previous surgery: Secondary | ICD-10-CM

## 2018-04-04 DIAGNOSIS — O09529 Supervision of elderly multigravida, unspecified trimester: Secondary | ICD-10-CM | POA: Insufficient documentation

## 2018-04-04 DIAGNOSIS — Z6841 Body Mass Index (BMI) 40.0 and over, adult: Secondary | ICD-10-CM

## 2018-04-04 DIAGNOSIS — O2621 Pregnancy care for patient with recurrent pregnancy loss, first trimester: Secondary | ICD-10-CM

## 2018-04-04 DIAGNOSIS — O209 Hemorrhage in early pregnancy, unspecified: Secondary | ICD-10-CM

## 2018-04-04 DIAGNOSIS — O0990 Supervision of high risk pregnancy, unspecified, unspecified trimester: Secondary | ICD-10-CM

## 2018-04-04 DIAGNOSIS — O2620 Pregnancy care for patient with recurrent pregnancy loss, unspecified trimester: Secondary | ICD-10-CM | POA: Insufficient documentation

## 2018-04-04 DIAGNOSIS — N926 Irregular menstruation, unspecified: Secondary | ICD-10-CM | POA: Diagnosis not present

## 2018-04-04 DIAGNOSIS — Z124 Encounter for screening for malignant neoplasm of cervix: Secondary | ICD-10-CM

## 2018-04-04 DIAGNOSIS — O09521 Supervision of elderly multigravida, first trimester: Secondary | ICD-10-CM

## 2018-04-04 DIAGNOSIS — Z131 Encounter for screening for diabetes mellitus: Secondary | ICD-10-CM

## 2018-04-04 DIAGNOSIS — Z113 Encounter for screening for infections with a predominantly sexual mode of transmission: Secondary | ICD-10-CM

## 2018-04-04 LAB — POCT URINE PREGNANCY: Preg Test, Ur: POSITIVE — AB

## 2018-04-04 NOTE — Progress Notes (Signed)
New Obstetric Patient H&P   Chief Complaint: "Desires prenatal care"  History of Present Illness: Patient is a 37 y.o. [id number]  Not Hispanic or Latino female, sure LMP 03/08/18 presents with amenorrhea and positive home pregnancy test. Based on her  LMP, her EDD is Estimated Date of Delivery: 12/13/18 and her EGA is [redacted]w[redacted]d. Cycles are 5. days, regular, and occur approximately every : 26 days. Her last pap smear was an unknown amount of time. She believes the result was normal.   She had a urine pregnancy test which was positive 2 day(s)  ago. Her last menstrual period was normal and lasted for  5 day(s). Since her LMP she claims she has experienced no issues. She had some vaginal bleeding Monday and yesterday.  It was light-to-bright pink and yesterday there was brown-ish bleeding and today nothing. The bleeding was only ever noted when she wiped. Her past medical history is noncontributory. Her prior pregnancies are notable for pre-eclampsia. She has had two elective abortions and two spontaneous abortions. The father of all the other children is different than the current.  Since her LMP, she admits to the use of tobacco products  no She claims she has gained Zero pounds since the start of her pregnancy.  There are cats in the home in the home  no  She admits close contact with children on a regular basis  no  She has had chicken pox in the past unknown She has had Tuberculosis exposures, symptoms, or previously tested positive for TB   no Current or past history of domestic violence. no  Genetic Screening/Teratology Counseling: (Includes patient, baby's father, or anyone in either family with:)   1. Patient's age >/= 53 at [facility]  yes 2. Thalassemia ([place], [place], Mediterranean, or Asian background): MCV<80  no 3. Neural tube defect (meningomyelocele, spina bifida, anencephaly)  no 4. Congenital heart defect  Yes, FOB had hole in his heart, that had to be closed surgically.  Her other son had an  opening in his heart that closed on its own. The FOB is different.  5. Down syndrome  no 6. Tay-Sachs (Jewish, [place]  no 7. Canavan's Disease  no 8. Sickle cell disease or trait (African)  no  9. Hemophilia or other blood disorders  no  10. Muscular dystrophy  no  11. Cystic fibrosis  no  12. Huntington's Chorea  no  13. Mental retardation/autism  no 14. Other inherited genetic or chromosomal disorder  no 15. Maternal metabolic disorder (DM, PKU, etc)  no 16. Patient or FOB with a child with a birth defect not listed above no  16a. Patient or FOB with a birth defect themselves yes, as noted above with FOB 17. Recurrent pregnancy loss, or stillbirth  yes  18. Any medications since LMP other than prenatal vitamins (include vitamins, supplements, OTC meds, drugs, alcohol)  no 19. Any other genetic/environmental exposure to discuss  no  Infection History:   1. Lives with someone with TB or TB exposed  no  2. Patient or partner has history of genital herpes  no 3. Rash or viral illness since LMP  no 4. History of STI (GC, CT, HPV, syphilis, HIV)  Yes, a few years and it was treated. 5. History of recent travel :  no  Other pertinent information:  no    Review of Systems: Review of Systems  Constitutional: Negative.   HENT: Negative.   Eyes: Negative.   Respiratory: Negative.   Cardiovascular: Negative.  Gastrointestinal: Negative.   Genitourinary: Negative.   Musculoskeletal: Negative.   Skin: Negative.   Neurological: Negative.   Psychiatric/Behavioral: Negative.      Past Medical History: denies  Past Surgical History:  Procedure Laterality Date  . CESAREAN SECTION    . DILATION AND CURETTAGE OF UTERUS    . DILATION AND EVACUATION N/A 09/22/2015   Procedure: DILATATION AND EVACUATION;  Surgeon: Vena Austria, MD;  Location: [facility] ORS;  Service: Gynecology;  Laterality: N/A;  . KNEE ARTHROSCOPY Right    Gynecologic History: Patient's last menstrual  period was 03/08/2018 (exact date).  Obstetric History: [id number] G1: LTCS at term, complicated by preeclampsia G2: EAB G3: EAB G4: early SAB G5: early SAB G6 current  Family History: maternal grandfather with prostate cancer, maternal aunt with breast cancer, sister has epilepsy.   Social History   Socioeconomic History  . Marital status: Divorced    Spouse name: Not on file  . Number of children: Not on file  . Years of education: Not on file  . Highest education level: Not on file  Occupational History  . Not on file  Social Needs  . Financial resource strain: Not on file  . Food insecurity:    Worry: Not on file    Inability: Not on file  . Transportation needs:    Medical: Not on file    Non-medical: Not on file  Tobacco Use  . Smoking status: Never Smoker  . Smokeless tobacco: Never Used  Substance and Sexual Activity  . Alcohol use: No  . Drug use: No  . Sexual activity: Yes    Birth control/protection: None  Lifestyle  . Physical activity:    Days per week: Not on file    Minutes per session: Not on file  . Stress: Not on file  Relationships  . Social connections:    Talks on phone: Not on file    Gets together: Not on file    Attends religious service: Not on file    Active member of club or organization: Not on file    Attends meetings of clubs or organizations: Not on file    Relationship status: Not on file  . Intimate partner violence:    Fear of current or ex partner: Not on file    Emotionally abused: Not on file    Physically abused: Not on file    Forced sexual activity: Not on file  Other Topics Concern  . Not on file  Social History Narrative  . Not on file   Allergies: No Known Allergies  Prior to Admission medications   Medication Sig Start Date End Date Taking? Authorizing Provider  Prenatal Vit-Fe Fumarate-FA (PRENATAL VITAMIN PO) Take by mouth.   Yes [provider]    Physical Exam BP 128/88   LMP 03/08/2018 (Exact  Date)   Physical Exam  Constitutional: She is oriented to person, place, and time. She appears well-developed and well-nourished. No distress.  HENT:  Head: Normocephalic and atraumatic.  Eyes: Conjunctivae are normal.  Neck: Normal range of motion. Neck supple. No thyromegaly present.  Cardiovascular: Normal rate, regular rhythm and normal heart sounds. Exam reveals no gallop and no friction rub.  No murmur heard. Pulmonary/Chest: Effort normal and breath sounds normal. She has no wheezes.  Abdominal: Soft. She exhibits no distension and no mass. There is no tenderness. There is no rebound and no guarding. No hernia. Hernia confirmed negative in the right inguinal area and confirmed negative in the  left inguinal area.  Genitourinary: Pelvic exam was performed with patient supine. There is no rash, tenderness or lesion on the right labia. There is no rash, tenderness or lesion on the left labia.  Musculoskeletal: Normal range of motion.  Lymphadenopathy:       Right: No inguinal adenopathy present.       Left: No inguinal adenopathy present.  Neurological: She is alert and oriented to person, place, and time.  Skin: Skin is warm and dry. No rash noted.  Psychiatric: She has a normal mood and affect. Her behavior is normal.    Female Chaperone present during breast and/or pelvic exam.  Assessment: 37 y.o. [id number] at [redacted]w[redacted]d presenting to initiate prenatal care  Plan: 1) Avoid alcoholic beverages. 2) Patient encouraged not to smoke.  3) Discontinue the use of all non-medicinal drugs and chemicals.  4) Take prenatal vitamins daily.  5) Nutrition, food safety (fish, cheese advisories, and high nitrite foods) and exercise discussed. 6) Hospital and practice style discussed with cross coverage system.  7) Genetic Screening, such as with 1st Trimester Screening, cell free fetal DNA, AFP testing, and Ultrasound, as well as with amniocentesis and CVS as appropriate, is discussed with patient. At  the conclusion of today's visit patient undecided genetic testing 8) Patient is asked about travel to areas at risk for the [place] virus, and counseled to avoid travel and exposure to mosquitoes or sexual partners who may have themselves been exposed to the virus. Testing is discussed, and will be ordered as appropriate.  9) first trimester bleeding: will obtain quant hCG. Light positive on UPT today.  Will trend early on, given her history of SABs. Ultrasound at appropriate gestational age if hCG trending normally. 10) FOB with heart defect at birth. Consideration for ECHO for fetus at about 22 weeks. 11) obesity: will need to follow > 40 BMI protocol (early 1h gtt, recommend genetic screening testing, APT in 3rd trimester, anesthesia consult per our hospital anesthesia request. 12) history c-section: will discuss TOLAC vs repeat at a later date.   Thomasene MohairStephen Sonya Pucci, MD 04/04/2018 9:52 AM

## 2018-04-05 ENCOUNTER — Encounter: Payer: Self-pay | Admitting: Obstetrics and Gynecology

## 2018-04-06 ENCOUNTER — Other Ambulatory Visit: Payer: BLUE CROSS/BLUE SHIELD

## 2018-04-06 ENCOUNTER — Other Ambulatory Visit: Payer: Self-pay | Admitting: Obstetrics and Gynecology

## 2018-04-06 DIAGNOSIS — O209 Hemorrhage in early pregnancy, unspecified: Secondary | ICD-10-CM

## 2018-04-06 DIAGNOSIS — O2621 Pregnancy care for patient with recurrent pregnancy loss, first trimester: Secondary | ICD-10-CM

## 2018-04-06 LAB — URINE DRUG PANEL 7
AMPHETAMINES, URINE: NEGATIVE ng/mL
BENZODIAZEPINE QUANT UR: NEGATIVE ng/mL
Barbiturate Quant, Ur: NEGATIVE ng/mL
Cannabinoid Quant, Ur: NEGATIVE ng/mL
Cocaine (Metab.): NEGATIVE ng/mL
Opiate Quant, Ur: NEGATIVE ng/mL
PCP Quant, Ur: NEGATIVE ng/mL

## 2018-04-06 LAB — URINE CULTURE: ORGANISM ID, BACTERIA: NO GROWTH

## 2018-04-07 LAB — IGP,CTNG,APTIMAHPV,RFX16/18,45
Chlamydia, Nuc. Acid Amp: NEGATIVE
Gonococcus by Nucleic Acid Amp: NEGATIVE
HPV APTIMA: NEGATIVE
PAP SMEAR COMMENT: 0

## 2018-04-07 LAB — BETA HCG QUANT (REF LAB): hCG Quant: 354 m[IU]/mL

## 2018-04-09 LAB — RPR+RH+ABO+RUB AB+AB SCR+CB...
Antibody Screen: NEGATIVE
HIV Screen 4th Generation wRfx: NONREACTIVE
Hematocrit: 42 % (ref 34.0–46.6)
Hemoglobin: 13.7 g/dL (ref 11.1–15.9)
Hepatitis B Surface Ag: NEGATIVE
MCH: 28.2 pg (ref 26.6–33.0)
MCHC: 32.6 g/dL (ref 31.5–35.7)
MCV: 87 fL (ref 79–97)
PLATELETS: 279 10*3/uL (ref 150–379)
RBC: 4.85 x10E6/uL (ref 3.77–5.28)
RDW: 13.6 % (ref 12.3–15.4)
RPR: NONREACTIVE
RUBELLA: 1.98 {index} (ref >0.99)
Rh Factor: POSITIVE
VARICELLA: 151 {index} — AB (ref >165)
WBC: 7.1 10*3/uL (ref 3.4–10.8)

## 2018-04-09 LAB — HEMOGLOBINOPATHY EVALUATION
HGB C: 0 %
HGB S: 0 %
HGB VARIANT: 0 %
Hemoglobin A2 Quantitation: 2.5 % (ref 1.8–3.2)
Hemoglobin F Quantitation: 0 % (ref 0.0–2.0)
Hgb A: 97.5 % (ref 96.4–98.8)

## 2018-04-09 LAB — BETA HCG QUANT (REF LAB): hCG Quant: 157 m[IU]/mL

## 2018-04-09 LAB — TSH+FREE T4
Free T4: 1.25 ng/dL (ref 0.82–1.77)
TSH: 1.9 u[IU]/mL (ref 0.450–4.500)

## 2018-04-10 ENCOUNTER — Encounter (INDEPENDENT_AMBULATORY_CARE_PROVIDER_SITE_OTHER): Payer: Self-pay

## 2018-04-13 ENCOUNTER — Other Ambulatory Visit: Payer: Self-pay | Admitting: Obstetrics and Gynecology

## 2018-04-13 ENCOUNTER — Encounter: Payer: Self-pay | Admitting: Obstetrics and Gynecology

## 2018-04-13 ENCOUNTER — Other Ambulatory Visit: Payer: BLUE CROSS/BLUE SHIELD

## 2018-04-13 DIAGNOSIS — O209 Hemorrhage in early pregnancy, unspecified: Secondary | ICD-10-CM

## 2018-04-14 LAB — BETA HCG QUANT (REF LAB): hCG Quant: 4502 m[IU]/mL

## 2018-04-18 ENCOUNTER — Other Ambulatory Visit: Payer: BLUE CROSS/BLUE SHIELD

## 2018-04-18 DIAGNOSIS — O209 Hemorrhage in early pregnancy, unspecified: Secondary | ICD-10-CM

## 2018-04-18 DIAGNOSIS — O0990 Supervision of high risk pregnancy, unspecified, unspecified trimester: Secondary | ICD-10-CM

## 2018-04-18 NOTE — Progress Notes (Signed)
Lab order beta hcg placed for today

## 2018-04-19 ENCOUNTER — Telehealth: Payer: Self-pay

## 2018-04-19 LAB — BETA HCG QUANT (REF LAB): HCG QUANT: 19543 m[IU]/mL

## 2018-04-19 NOTE — Telephone Encounter (Signed)
Can you look and see if levels are okay/rising like they should since SDJ not in office?

## 2018-04-19 NOTE — Telephone Encounter (Signed)
Left message for patient regarding HCG results increasing.

## 2018-04-19 NOTE — Telephone Encounter (Signed)
PT called triage line stating she sees SDJ and would like the HCG results. CB# [phone]

## 2018-04-25 ENCOUNTER — Other Ambulatory Visit: Payer: BLUE CROSS/BLUE SHIELD

## 2018-04-25 ENCOUNTER — Ambulatory Visit (INDEPENDENT_AMBULATORY_CARE_PROVIDER_SITE_OTHER): Payer: BLUE CROSS/BLUE SHIELD

## 2018-04-25 ENCOUNTER — Encounter: Payer: Self-pay | Admitting: Maternal Newborn

## 2018-04-25 ENCOUNTER — Ambulatory Visit (INDEPENDENT_AMBULATORY_CARE_PROVIDER_SITE_OTHER): Payer: BLUE CROSS/BLUE SHIELD | Admitting: Maternal Newborn

## 2018-04-25 VITALS — BP 110/70 | Wt 277.0 lb

## 2018-04-25 DIAGNOSIS — O0990 Supervision of high risk pregnancy, unspecified, unspecified trimester: Secondary | ICD-10-CM

## 2018-04-25 DIAGNOSIS — Z131 Encounter for screening for diabetes mellitus: Secondary | ICD-10-CM

## 2018-04-25 DIAGNOSIS — Z3A01 Less than 8 weeks gestation of pregnancy: Secondary | ICD-10-CM | POA: Diagnosis not present

## 2018-04-25 DIAGNOSIS — Z6841 Body Mass Index (BMI) 40.0 and over, adult: Secondary | ICD-10-CM

## 2018-04-25 DIAGNOSIS — Q513 Bicornate uterus: Secondary | ICD-10-CM

## 2018-04-25 DIAGNOSIS — O209 Hemorrhage in early pregnancy, unspecified: Secondary | ICD-10-CM

## 2018-04-25 DIAGNOSIS — O3401 Maternal care for unspecified congenital malformation of uterus, first trimester: Secondary | ICD-10-CM | POA: Insufficient documentation

## 2018-04-25 DIAGNOSIS — O99211 Obesity complicating pregnancy, first trimester: Secondary | ICD-10-CM

## 2018-04-25 NOTE — Progress Notes (Signed)
    Routine Prenatal Care Visit  Subjective  Evelyn Wright is a 37 y.o. [id number] at [redacted]w[redacted]d being seen today for ongoing prenatal care.  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  ----------------------------------------------------------------------------------- Patient reports occasional nausea and light pink spotting when she wipes. Last episode of spotting on 5/13. ----------------------------------------------------------------------------------- The following portions of the patient's history were reviewed and updated as appropriate: allergies, current medications, past family history, past medical history, past social history, past surgical history and problem list. Problem list updated.   Objective  Last menstrual period 03/08/2018. Pregravid weight 270 lb (122.5 kg) Total Weight Gain 7 lb (3.175 kg) Urinalysis: Urine Protein: Negative Urine Glucose: Negative  Fetal Status: Fetal Heart Rate (bpm): 120 (ultrasound)  General:  Alert, oriented and cooperative. Patient is in no acute distress.  Skin: Skin is warm and dry. No rash noted.   Cardiovascular: Normal heart rate noted  Respiratory: Normal respiratory effort, no problems with respiration noted  Abdomen: Soft, gravid, appropriate for gestational age.       Pelvic:  Cervical exam deferred        Extremities: Normal range of motion.     Mental Status: Normal mood and affect. Normal behavior. Normal judgment and thought content.      Assessment   37 y.o. [id number] at [redacted]w[redacted]d, EDD 12/13/2018 by Last Menstrual Period presenting for routine prenatal visit.  Plan   pregnancy Problems (from 04/04/18 to present)    Problem Noted Resolved   Supervision of high risk pregnancy, antepartum 04/04/2018 by Evelyn Novak, MD No   Obesity affecting pregnancy 04/04/2018 by Evelyn Novak, MD No   BMI 45.0-49.9, adult [facility]) 04/04/2018 by Evelyn Novak, MD No   Advanced maternal age in multigravida 04/04/2018 by Evelyn Novak, MD No   First trimester bleeding 04/04/2018 by Evelyn Novak, MD No   Pregnancy complicated by previous recurrent miscarriages 04/04/2018 by Evelyn Novak, MD No      Ultrasound today shows single IUP with fetal cardiac activity and size=dates. Bicornuate uterus noted, previously unknown to her. Pregnancy is on the left side.   Discussed anatomical significance and additional surveillance such as cervical lengths, possible referral to [facility] as needed.  Offered Bonjesta samples for nausea, she declines at this time.  Desires genetic screening; MaterniTi 21 if covered and first trimester screen otherwise.  Return in about 1 month (around 05/23/2018) for ROB.  Evelyn Wright, CNM 04/25/2018  11:26 AM

## 2018-04-25 NOTE — Progress Notes (Signed)
C/o what u/s tech found with her uterus.rj

## 2018-04-26 LAB — GLUCOSE, 1 HOUR GESTATIONAL: GESTATIONAL DIABETES SCREEN: 69 mg/dL (ref 65–139)

## 2018-04-27 ENCOUNTER — Encounter: Payer: Self-pay | Admitting: Obstetrics and Gynecology

## 2018-05-23 ENCOUNTER — Encounter: Payer: Self-pay | Admitting: Obstetrics and Gynecology

## 2018-05-23 ENCOUNTER — Ambulatory Visit (INDEPENDENT_AMBULATORY_CARE_PROVIDER_SITE_OTHER): Payer: BLUE CROSS/BLUE SHIELD | Admitting: Obstetrics and Gynecology

## 2018-05-23 VITALS — BP 108/70 | Wt 282.0 lb

## 2018-05-23 DIAGNOSIS — O209 Hemorrhage in early pregnancy, unspecified: Secondary | ICD-10-CM

## 2018-05-23 DIAGNOSIS — O99211 Obesity complicating pregnancy, first trimester: Secondary | ICD-10-CM

## 2018-05-23 DIAGNOSIS — Z1379 Encounter for other screening for genetic and chromosomal anomalies: Secondary | ICD-10-CM

## 2018-05-23 DIAGNOSIS — Z6841 Body Mass Index (BMI) 40.0 and over, adult: Secondary | ICD-10-CM

## 2018-05-23 DIAGNOSIS — Z98891 History of uterine scar from previous surgery: Secondary | ICD-10-CM

## 2018-05-23 DIAGNOSIS — O0990 Supervision of high risk pregnancy, unspecified, unspecified trimester: Secondary | ICD-10-CM

## 2018-05-23 DIAGNOSIS — Q513 Bicornate uterus: Secondary | ICD-10-CM

## 2018-05-23 DIAGNOSIS — O2621 Pregnancy care for patient with recurrent pregnancy loss, first trimester: Secondary | ICD-10-CM

## 2018-05-23 DIAGNOSIS — O09521 Supervision of elderly multigravida, first trimester: Secondary | ICD-10-CM

## 2018-05-23 DIAGNOSIS — O3401 Maternal care for unspecified congenital malformation of uterus, first trimester: Secondary | ICD-10-CM

## 2018-05-23 NOTE — Progress Notes (Signed)
Routine Prenatal Care Visit  Subjective  Evelyn Wright is a 37 y.o. [id number] at [redacted]w[redacted]d being seen today for ongoing prenatal care.  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  ----------------------------------------------------------------------------------- Patient reports bleeding.  No pain, small pink when wiped.   . Vag. Bleeding: Scant.   . Denies leaking of fluid.  ----------------------------------------------------------------------------------- The following portions of the patient's history were reviewed and updated as appropriate: allergies, current medications, past family history, past medical history, past social history, past surgical history and problem list. Problem list updated.   Objective  Blood pressure 108/70, weight 282 lb (127.9 kg), last menstrual period 03/08/2018. Pregravid weight 270 lb (122.5 kg) Total Weight Gain 12 lb (5.443 kg) Urinalysis: Urine Protein: Negative Urine Glucose: Negative  Fetal Status:           General:  Alert, oriented and cooperative. Patient is in no acute distress.  Skin: Skin is warm and dry. No rash noted.   Cardiovascular: Normal heart rate noted  Respiratory: Normal respiratory effort, no problems with respiration noted  Abdomen: Soft, gravid, appropriate for gestational age. Pain/Pressure: Present     Pelvic:  Cervical exam deferred        Extremities: Normal range of motion.     ental Status: Normal mood and affect. Normal  behavior. Normal judgment and thought content.     Assessment   37 y.o. [id number] at [redacted]w[redacted]d by  12/13/2018, by Last Menstrual Period presenting for routine prenatal visit  Plan   pregnancy Problems (from 04/04/18 to present)    Problem Noted Resolved   Supervision of high risk pregnancy, antepartum 04/04/2018 by Conard Novak, MD No   Overview Signed 05/23/2018 11:38 AM by Natale Milch, MD      Clinic [facility] Prenatal Labs  Dating  Blood type: B/Positive/-- (04/24 1053)   Genetic Screen 1 Screen:     AFP:      Quad:      NIPS:    Antibody:Negative (04/24 1053)  Anatomic [place]  Rubella: 1.98 (04/24 1053) Varicella: Nonimmune  GTT Early:    69    28 wk:      RPR: Non Reactive (04/24 1053)   Rhogam positive HBsAg: Negative (04/24 1053)   TDaP vaccine                       HIV: Non Reactive (04/24 1053)   Flu Shot                                GBS:   Contraception  Pap:  CBB     CS/VBAC    Baby Food    Support Person             Obesity affecting pregnancy 04/04/2018 by Conard Novak, MD No   BMI 45.0-49.9, adult (HCC) 04/04/2018 by Conard Novak, MD No   Advanced maternal age in multigravida 04/04/2018 by Conard Novak, MD No   First trimester bleeding 04/04/2018 by  Conard NovakJackson, Stephen D, MD No   Pregnancy complicated by previous recurrent miscarriages 04/04/2018 by Conard NovakJackson, Stephen D, MD No       Gestational age appropriate obstetric precautions including but not limited to vaginal bleeding, contractions, leaking of fluid and fetal movement were reviewed in detail with the patient.    Body mass index is 47.66 kg/m. Discussed weight gain goals for 0-15 lbs for pregnancy. Patient has already gained 12lbs. Discussed that if she continues to gain weight she will need to be referred out for care.  Nutrition Consult. Anesthesia consult. May need to be transferred out for care.  Spotting today, faint pink on dossile. No active bleeding, no pain, reassurance.  Bedside US showed FHR 171bpm.  Materniti21 testing today Return in about 2 weeks (around 06/06/2018) for ROB.  Adelene Idlerhristanna Dena Esperanza MD [facility], [facility] 05/23/18 11:44 AM

## 2018-05-24 ENCOUNTER — Encounter: Payer: BLUE CROSS/BLUE SHIELD | Admitting: Obstetrics and Gynecology

## 2018-05-24 ENCOUNTER — Encounter: Payer: Self-pay | Admitting: Intensive Care

## 2018-05-24 ENCOUNTER — Emergency Department
Admission: EM | Admit: 2018-05-24 | Discharge: 2018-05-24 | Disposition: A | Discharge status: Home / Self Care | Payer: BLUE CROSS/BLUE SHIELD | Attending: Emergency Medicine | Admitting: Emergency Medicine

## 2018-05-24 ENCOUNTER — Other Ambulatory Visit: Payer: Self-pay

## 2018-05-24 ENCOUNTER — Telehealth: Payer: Self-pay

## 2018-05-24 DIAGNOSIS — Z79899 Other long term (current) drug therapy: Secondary | ICD-10-CM | POA: Insufficient documentation

## 2018-05-24 DIAGNOSIS — O209 Hemorrhage in early pregnancy, unspecified: Secondary | ICD-10-CM | POA: Insufficient documentation

## 2018-05-24 DIAGNOSIS — Z3401 Encounter for supervision of normal first pregnancy, first trimester: Secondary | ICD-10-CM | POA: Diagnosis not present

## 2018-05-24 DIAGNOSIS — Z3A11 11 weeks gestation of pregnancy: Secondary | ICD-10-CM | POA: Diagnosis not present

## 2018-05-24 LAB — URINALYSIS, COMPLETE (UACMP) WITH MICROSCOPIC
Bacteria, UA: NONE SEEN
Bilirubin Urine: NEGATIVE
GLUCOSE, UA: NEGATIVE mg/dL
Ketones, ur: NEGATIVE mg/dL
Leukocytes, UA: NEGATIVE
Nitrite: NEGATIVE
PH: 6 (ref 5.0–8.0)
PROTEIN: NEGATIVE mg/dL
RBC / HPF: >50 RBC/hpf — ABNORMAL HIGH (ref 0–5)
SPECIFIC GRAVITY, URINE: 1.024 (ref 1.005–1.030)

## 2018-05-24 LAB — POCT PREGNANCY, URINE: PREG TEST UR: POSITIVE — AB

## 2018-05-24 LAB — CBC
HCT: 37 % (ref 35.0–47.0)
HEMOGLOBIN: 12.5 g/dL (ref 12.0–16.0)
MCH: 28.8 pg (ref 26.0–34.0)
MCHC: 33.8 g/dL (ref 32.0–36.0)
MCV: 85.2 fL (ref 80.0–100.0)
PLATELETS: 252 10*3/uL (ref 150–440)
RBC: 4.34 MIL/uL (ref 3.80–5.20)
RDW: 13.8 % (ref 11.5–14.5)
WBC: 11.2 10*3/uL — ABNORMAL HIGH (ref 3.6–11.0)

## 2018-05-24 LAB — HCG, QUANTITATIVE, PREGNANCY: hCG, Beta Chain, Quant, S: 108461 m[IU]/mL — ABNORMAL HIGH (ref <5)

## 2018-05-24 LAB — ABO/RH: ABO/RH(D): B POS

## 2018-05-24 NOTE — Telephone Encounter (Signed)
LMVM TRC. Notified we scheduled her apt @11 :30 today w/SDJ for eval. Pt advised to return call to verify she can keep this apt.

## 2018-05-24 NOTE — Telephone Encounter (Signed)
Pt reports she went for a walk this a.m. & went to the bathroom when she returned & had dark red bleeding. This is the first time she has exercised since she has been pregnant. Cb#[phone]

## 2018-05-24 NOTE — ED Provider Notes (Signed)
[facility] Emergency Department Provider Note  ____________________________________________  Time seen: Approximately 5:34 PM  I have reviewed the triage vital signs and the nursing notes.   HISTORY  Chief Complaint No chief complaint on file.   HPI Evelyn Wright is a 37 y.o. female currently [redacted] weeks pregnant who presents for evaluation of vaginal bleeding.  This is patient's sixth pregnancy.  She had 2 abortions, 2 miscarriages and one live birth.  This is a wanted pregnancy.  She reports intermittent spotting throughout the pregnancy.  Today she has noted darker heavier bleeding that started after she went for a 30 min walk.  The bleeding is mild. She denies needing any pads.  She reports that the bleeding only comes out when she sits in the toilet.  No clots.  No abdominal pain, no vomiting, no dizziness.  Patient saw her OB/GYN yesterday and had an exam showing a closed cervix.  Patient Active Problem List   Diagnosis Date Noted  . 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 Supervision of high risk pregnancy, antepartum 04/04/2018  . Obesity affecting pregnancy 04/04/2018  . BMI 45.0-49.9, adult (HCC) 04/04/2018  . Advanced maternal age in multigravida 04/04/2018  . First trimester bleeding 04/04/2018  . Pregnancy complicated by previous recurrent miscarriages 04/04/2018  . History of cesarean section 09/20/2017  . Class 3 severe obesity without serious comorbidity with body mass index (BMI) of 40.0 to 44.9 in adult [facility]) 09/20/2017    Past Surgical History:  Procedure Laterality Date  . CESAREAN SECTION    . DILATION AND CURETTAGE OF UTERUS    . DILATION AND EVACUATION N/A 09/22/2015   Procedure: DILATATION AND EVACUATION;  Surgeon: Vena Austria, MD;  Location: [facility] ORS;  Service: Gynecology;  Laterality: N/A;  . KNEE ARTHROSCOPY Right     Prior to Admission medications   Medication Sig Start Date  End Date Taking? Authorizing Provider  Levonorgestrel-Ethinyl Estradiol (AMETHIA,CAMRESE) 0.15-0.03 &0.01 MG tablet Take 1 tablet by mouth daily. 07/17/17   [provider]  Prenatal Vit-Fe Fumarate-FA (PRENATAL VITAMIN PO) Take by mouth.    [provider]    Allergies Patient has no known allergies.  History reviewed. No pertinent family history.  Social History Social History   Tobacco Use  . Smoking status: Never Smoker  . Smokeless tobacco: Never Used  Substance Use Topics  . Alcohol use: No  . Drug use: No    Review of Systems  Constitutional: Negative for fever. Eyes: Negative for visual changes. ENT: Negative for sore throat. Neck: No neck pain  Cardiovascular: Negative for chest pain. Respiratory: Negative for shortness of breath. Gastrointestinal: Negative for abdominal pain, vomiting or diarrhea. Genitourinary: Negative for dysuria. + vaginal bleeding Musculoskeletal: Negative for back pain. Skin: Negative for rash. Neurological: Negative for headaches, weakness or numbness. Psych: No SI or HI  ____________________________________________   PHYSICAL EXAM:  VITAL SIGNS: ED Triage Vitals  Enc Vitals Group     BP 05/24/18 1703 (!) 121/59     Pulse Rate 05/24/18 1702 75     Resp --      Temp 05/24/18 1702 98.4 F (36.9 C)     Temp Source 05/24/18 1702 Oral     SpO2 05/24/18 1703 100 %     Weight --      Height 05/24/18 1708 5\' 4"  (1.626 m)     Head Circumference --      Peak Flow --  Pain Score 05/24/18 1708 0     Pain Loc --      Pain Edu? --      Excl. in GC? --     Constitutional: Alert and oriented. Well appearing and in no apparent distress. HEENT:      Head: Normocephalic and atraumatic.         Eyes: Conjunctivae are normal. Sclera is non-icteric.       Mouth/Throat: Mucous membranes are moist.       Neck: Supple with no signs of meningismus. Cardiovascular: Regular rate and rhythm. No murmurs, gallops, or rubs. 2+  symmetrical distal pulses are present in all extremities. No JVD. Respiratory: Normal respiratory effort. Lungs are clear to auscultation bilaterally. No wheezes, crackles, or rhonchi.  Gastrointestinal: Soft, non tender, and non distended with positive bowel sounds. No rebound or guarding. Musculoskeletal: Nontender with normal range of motion in all extremities. No edema, cyanosis, or erythema of extremities. Neurologic: Normal speech and language. Face is symmetric. Moving all extremities. No gross focal neurologic deficits are appreciated. Skin: Skin is warm, dry and intact. No rash noted. Psychiatric: Mood and affect are normal. Speech and behavior are normal.  ____________________________________________   LABS (all labs ordered are listed, but only abnormal results are displayed)  Labs Reviewed  CBC - Abnormal; Notable for the following components:      Result Value   WBC 11.2 (*)    All other components within normal limits  URINALYSIS, COMPLETE (UACMP) WITH MICROSCOPIC - Abnormal; Notable for the following components:   Color, Urine YELLOW (*)    APPearance HAZY (*)    Hgb urine dipstick LARGE (*)    RBC / HPF >50 (*)    All other components within normal limits  HCG, QUANTITATIVE, PREGNANCY - Abnormal; Notable for the following components:   hCG, Beta Chain, Quant, S [phone] (*)    All other components within normal limits  POCT PREGNANCY, URINE - Abnormal; Notable for the following components:   Preg Test, Ur POSITIVE (*)    All other components within normal limits  ABO/RH   ____________________________________________  EKG  none  ____________________________________________  RADIOLOGY  none  ____________________________________________   PROCEDURES  Procedure(s) performed: None Procedures Critical Care performed:  None ____________________________________________   INITIAL IMPRESSION / ASSESSMENT AND PLAN / ED COURSE  37 y.o. female currently [redacted]  weeks pregnant who presents for evaluation of mild vaginal bleeding.  Bedside ultrasound shows an IUP with great fetal movement and fetal heart rate of 162.  No need to repeat cervix exam as was done yesterday by OB/GYN.  Will check ABO to evaluate for need of RhoGam.  Will check CBC to rule out acute blood loss anemia.  Will check hCG so OB/GYN can trended in case patient continues to bleed and miscarriage is of concern.     _________________________ 6:30 PM on 05/24/2018 ----------------------------------------- Patient's blood type is B+ with no indication for RhoGam.  CBC showing stable hemoglobin with no signs of acute blood loss anemia.  hCG is trending up appropriately.  Discussed pelvic rest with patient and follow-up with OB/GYN on Monday.  Discussed signs and symptoms of acute blood loss anemia and recommended return to the emergency room if these develop.    As part of my medical decision making, I reviewed the following data within the electronic MEDICAL RECORD NUMBER Nursing notes reviewed and incorporated, Labs reviewed , Old chart reviewed, Notes from prior ED visits and [place] Controlled Substance Database  Pertinent labs & imaging results that were available during my care of the patient were reviewed by me and considered in my medical decision making (see chart for details).    ____________________________________________   FINAL CLINICAL IMPRESSION(S) / ED DIAGNOSES  Final diagnoses:  Vaginal bleeding in pregnancy, first trimester      NEW MEDICATIONS STARTED DURING THIS VISIT:  ED Discharge Orders    None       Note:  This document was prepared using Dragon voice recognition software and may include unintentional dictation errors.    Don Perking, [place], MD 05/24/18 [phone]

## 2018-05-24 NOTE — ED Triage Notes (Signed)
Patient states "I am [redacted] weeks pregnant and started spotting yesterday. [facility] and [facility] told me my cervix was vascular. I am having heavy bleeding, dark red now. I feel irritated in my vagina" Pregnancy number 6 with one live birth. Last two pregnancies were miscarriages. Reports some mild abdominal cramping.

## 2018-05-25 ENCOUNTER — Encounter: Payer: BLUE CROSS/BLUE SHIELD | Admitting: Certified Nurse Midwife

## 2018-05-30 ENCOUNTER — Ambulatory Visit (INDEPENDENT_AMBULATORY_CARE_PROVIDER_SITE_OTHER): Payer: BLUE CROSS/BLUE SHIELD | Admitting: Certified Nurse Midwife

## 2018-05-30 ENCOUNTER — Encounter: Payer: Self-pay | Admitting: Certified Nurse Midwife

## 2018-05-30 VITALS — BP 112/66 | Wt 277.0 lb

## 2018-05-30 DIAGNOSIS — O09291 Supervision of pregnancy with other poor reproductive or obstetric history, first trimester: Secondary | ICD-10-CM

## 2018-05-30 DIAGNOSIS — O09299 Supervision of pregnancy with other poor reproductive or obstetric history, unspecified trimester: Secondary | ICD-10-CM | POA: Insufficient documentation

## 2018-05-30 DIAGNOSIS — O34219 Maternal care for unspecified type scar from previous cesarean delivery: Secondary | ICD-10-CM

## 2018-05-30 DIAGNOSIS — O26851 Spotting complicating pregnancy, first trimester: Secondary | ICD-10-CM

## 2018-05-30 DIAGNOSIS — Z3A11 11 weeks gestation of pregnancy: Secondary | ICD-10-CM

## 2018-05-30 NOTE — Progress Notes (Signed)
ER f/u vaginal bleeding. Bleeding has slowed down to only spotting when using the restroom.

## 2018-05-30 NOTE — Progress Notes (Signed)
FU spotting, now 11wk6days: Had some spotting in early pregnancy that stopped at 7 weeks, then restarted spotting last week. Notices just when wiping. No cramping with bleeding. No vulvar itching or irritation. Hx of a bicornate uterus, miscarriage x 2, abortion x 2 and cesarean section 2002 (also had preeclampsia) FCA: 164 with DT Pelvic exam: Vulva: no lesions; mild redness of vestibule VAgina: scant brown discharge Cervix: trace blood at cervical os, pink, not friable, no polyp present  thick and closed on cervical exam Wet prep: negative hyphae, Trich, clue cells A: Spotting at 11wk6 days/ viable pregnancy P: SAB precautions US ordered prior to next week appointment RTO for increased bleeding and cramping Evelyn Wright, CNM

## 2018-05-31 LAB — MATERNIT 21 PLUS CORE, BLOOD
CHROMOSOME 13: NEGATIVE
CHROMOSOME 21: NEGATIVE
Chromosome 18: NEGATIVE
Y CHROMOSOME: DETECTED

## 2018-06-05 ENCOUNTER — Ambulatory Visit (INDEPENDENT_AMBULATORY_CARE_PROVIDER_SITE_OTHER): Payer: BLUE CROSS/BLUE SHIELD

## 2018-06-05 DIAGNOSIS — Q513 Bicornate uterus: Secondary | ICD-10-CM | POA: Diagnosis not present

## 2018-06-05 DIAGNOSIS — O26851 Spotting complicating pregnancy, first trimester: Secondary | ICD-10-CM

## 2018-06-05 DIAGNOSIS — Z3A13 13 weeks gestation of pregnancy: Secondary | ICD-10-CM | POA: Diagnosis not present

## 2018-06-05 DIAGNOSIS — O3401 Maternal care for unspecified congenital malformation of uterus, first trimester: Secondary | ICD-10-CM | POA: Diagnosis not present

## 2018-06-06 ENCOUNTER — Encounter
Admission: RE | Admit: 2018-06-06 | Discharge: 2018-06-06 | Disposition: A | Discharge status: Home / Self Care | Payer: BLUE CROSS/BLUE SHIELD | Source: Ambulatory Visit | Attending: Anesthesiology | Admitting: Anesthesiology

## 2018-06-06 ENCOUNTER — Ambulatory Visit (INDEPENDENT_AMBULATORY_CARE_PROVIDER_SITE_OTHER): Payer: BLUE CROSS/BLUE SHIELD | Admitting: Maternal Newborn

## 2018-06-06 ENCOUNTER — Encounter: Payer: Self-pay | Admitting: Maternal Newborn

## 2018-06-06 VITALS — BP 130/84 | Wt 279.0 lb

## 2018-06-06 DIAGNOSIS — O09291 Supervision of pregnancy with other poor reproductive or obstetric history, first trimester: Secondary | ICD-10-CM

## 2018-06-06 DIAGNOSIS — O0990 Supervision of high risk pregnancy, unspecified, unspecified trimester: Secondary | ICD-10-CM

## 2018-06-06 DIAGNOSIS — Z3A12 12 weeks gestation of pregnancy: Secondary | ICD-10-CM

## 2018-06-06 DIAGNOSIS — O09521 Supervision of elderly multigravida, first trimester: Secondary | ICD-10-CM

## 2018-06-06 DIAGNOSIS — Z6841 Body Mass Index (BMI) 40.0 and over, adult: Secondary | ICD-10-CM

## 2018-06-06 DIAGNOSIS — O99211 Obesity complicating pregnancy, first trimester: Secondary | ICD-10-CM

## 2018-06-06 DIAGNOSIS — O34219 Maternal care for unspecified type scar from previous cesarean delivery: Secondary | ICD-10-CM

## 2018-06-06 DIAGNOSIS — O34591 Maternal care for other abnormalities of gravid uterus, first trimester: Secondary | ICD-10-CM

## 2018-06-06 DIAGNOSIS — Q513 Bicornate uterus: Secondary | ICD-10-CM

## 2018-06-06 NOTE — Patient Instructions (Signed)

## 2018-06-06 NOTE — Consult Note (Signed)
Anesthesiology:  We had the pleasure of seeing this 37 yo at 12 weeks 6days.  Her current BMI is 47.7.  She was counseled that if she gains much more weight and eclipses the 50 BMI mark than she would be sent to a larger OB center for more advanced care for the super morbidly obese patients.  Patient and husband understand and agree.   On physical exam, the airway is a mall 2 and currently the back has a fair amount of adipose , but interspaces are appreciated.

## 2018-06-06 NOTE — Progress Notes (Signed)
No concerns.rj 

## 2018-06-06 NOTE — Progress Notes (Signed)
Routine Prenatal Care Visit  Subjective  Evelyn Wright is a 37 y.o. [id number] at [redacted]w[redacted]d being seen today for ongoing prenatal care.  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  ----------------------------------------------------------------------------------- Patient reports that spotting continues, has not increased in amount or frequency. No cramping.   Vag. Bleeding: Scant.  ----------------------------------------------------------------------------------- The following portions of the patient's history were reviewed and updated as appropriate: allergies, current medications, past family history, past medical history, past social history, past surgical history and problem list. Problem list updated.   Objective  Blood pressure 130/84, weight 279 lb (126.6 kg), last menstrual period 03/08/2018. Pregravid weight 270 lb (122.5 kg) Total Weight Gain 9 lb (4.082 kg) Urinalysis:      Fetal Status: Fetal Heart Rate (bpm): 157         General:  Alert, oriented and cooperative. Patient is in no acute distress.  Skin: Skin is warm and dry. No rash noted.   Cardiovascular: Normal heart rate noted  Respiratory: Normal respiratory effort, no problems with respiration noted  Abdomen: Soft, gravid, appropriate for gestational age. Pain/Pressure: Absent     Pelvic:  Cervical exam deferred        Extremities: Normal range of motion.       Mental Status: Normal mood and affect. Normal behavior. Normal judgment and thought content.     Assessment   37 y.o. [id number] at [redacted]w[redacted]d, EDD 12/13/2018 by Last Menstrual Period presenting for routine prenatal visit.  Plan   pregnancy Problems (from 04/04/18 to present)    Problem Noted Resolved   Supervision of high risk pregnancy, antepartum 04/04/2018 by Conard Novak, MD No   Overview Addendum 05/23/2018 11:43 AM by Natale Milch, MD      [facility] Prenatal Labs  Dating  Blood type: B/Positive/-- (04/24 1053)   Genetic Screen 1 Screen:     AFP:      Quad:      NIPS:    Antibody:Negative (04/24 1053)  Anatomic [place]  Rubella: 1.98 (04/24 1053) Varicella: Nonimmune  GTT Early: 69       28 wk:      RPR: Non Reactive (04/24 1053)   Rhogam positive HBsAg: Negative (04/24 1053)   TDaP vaccine                       HIV: Non Reactive (04/24 1053)   Flu Shot                                GBS:   Contraception  Pap:  CBB     CS/VBAC    Baby Food    Support Person             Obesity affecting pregnancy 04/04/2018 by Conard Novak, MD No   BMI 45.0-49.9, adult (HCC) 04/04/2018 by Conard Novak, MD No   Advanced maternal age in multigravida 04/04/2018 by Conard Novak, MD No   First  trimester bleeding 04/04/2018 by Conard NovakJackson, Stephen D, MD No   Pregnancy complicated by previous recurrent miscarriages 04/04/2018 by Conard NovakJackson, Stephen D, MD No     Reviewed ultrasound done yesterday, did not see any findings that explain spotting or cause concern for hemorrhage. Size=dates and FHR wnl.   Also discussed that MaterniTi21 results are normal and it's a boy!  Please refer to After Visit Summary for other counseling recommendations.   Return in about 1 month (around 07/04/2018) for ROB.  Marcelyn BruinsJacelyn Dmarion Perfect, CNM 06/06/2018  1:26 PM

## 2018-06-13 ENCOUNTER — Encounter: Payer: BLUE CROSS/BLUE SHIELD | Attending: Obstetrics and Gynecology | Admitting: Dietician

## 2018-06-13 ENCOUNTER — Encounter: Payer: Self-pay | Admitting: Dietician

## 2018-06-13 VITALS — Ht 64.0 in | Wt 279.9 lb

## 2018-06-13 DIAGNOSIS — O99211 Obesity complicating pregnancy, first trimester: Secondary | ICD-10-CM | POA: Diagnosis not present

## 2018-06-13 NOTE — Progress Notes (Signed)
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 Visual . Hands-on   Current weight: 279.9lbs Height: 5'4" Medications, supplements: reconciled list in medical record  Progress and evaluation: Patient reports [redacted] weeks GA, EDD 12/13/18. Reports 12lb weight gain thus far during pregnancy, goal is 15lb weight gain for pregnancy due to pre-existing obesity.  She has stopped drinking sodas, but is eating her regular diet which includes frequent fast food meals. Denies any nausea at this time; some nausea at start of pregnancy. She feels she has somewhat of an addiction to food, including some binge eating and would like help in managing.   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   Dietary Intake:  Usual eating pattern includes 2-3 meals and 0-1 snacks per day. Dining out frequency: 4-6 meals per week.  Breakfast: sometimes skips; or eats cereal, or eggs and bacon; always drinks juice to take vitamins Snack: none Lunch: sometimes out for fast food; or frozen dinner Snack: none Supper: fast food; cooks at home-- chicken alfredo, spaghetti, tacos, etc. (what son likes) Snack: sometimes if hungry -- popcorn, chips, cookies (usually something sweet) Beverages: water, Orange juice in am  Nutrition Care Education: Topics covered: weight management during pregnancy; food addiction Basic nutrition: basic food groups, appropriate nutrient balance, appropriate meal and snack schedule, general nutrition guidelines    Weight control: choosing lowfat and low sugar foods; controlling food portions; limiting added fats; increasing vegetable intake;  Food addiction: importance of identifying triggers for eating; behavior chains; making changes gradually in  steps; avoiding eating from large containers and making "problem" foods less convenient; seeking out alternatives to eating; seeking professional [facility] counselor as part of nutrition therapy program.   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  As evidenced by patient verbal report of diet history. Bouton-3.3 Overweight/obesity As related to excess calories.  As evidenced by patient with pre-pregnancy BMI of 46, and current BMI of 48 during first trimester of pregnancy.  Intervention: Instruction and discussion as noted above.   Patient feels she can work to decrease portions of starchy foods.    [facility] pastoral care dept to contact patient for an appointment.   Education Materials given:  . Plate Planner with food lists . Food lists/ Planning A Balanced Meal . Sample meal pattern and menus . Goals/ instructions   Learner/ who was taught:  . Patient   Level of understanding: Marland Kitchen. Verbalizes/ demonstrates competency   Demonstrated degree of understanding via:   Teach back Learning barriers: . None  Willingness to learn/ readiness for change: . Eager, change in progress   Monitoring and Evaluation:  Dietary intake, exercise, and body weight      follow up: 07/16/18

## 2018-06-13 NOTE — Progress Notes (Signed)
Received message via email from [facility] counselor that a call is being placed to patient to schedule a counseling visit.

## 2018-06-13 NOTE — Patient Instructions (Signed)
   Decrease portions of starchy foods, keep to 1 cup (fist-size) or less.   Continue to drink plenty of water.

## 2018-06-18 ENCOUNTER — Telehealth: Payer: Self-pay

## 2018-06-18 ENCOUNTER — Ambulatory Visit (INDEPENDENT_AMBULATORY_CARE_PROVIDER_SITE_OTHER): Payer: BLUE CROSS/BLUE SHIELD | Admitting: Certified Nurse Midwife

## 2018-06-18 VITALS — BP 108/56 | Wt 285.0 lb

## 2018-06-18 DIAGNOSIS — O26852 Spotting complicating pregnancy, second trimester: Secondary | ICD-10-CM | POA: Insufficient documentation

## 2018-06-18 DIAGNOSIS — Z3A14 14 weeks gestation of pregnancy: Secondary | ICD-10-CM

## 2018-06-18 NOTE — Telephone Encounter (Signed)
Please add this patient to my schedule for this afternoon

## 2018-06-18 NOTE — Progress Notes (Signed)
C/o spotting since ~1pm.rj

## 2018-06-18 NOTE — Progress Notes (Signed)
Work in at 14wk4d: began spotting again at 1 PM. Noticed it when she wiped. No blood in toilet. No dysuria. No cramping, no vulvovaginal irritation, no IC It had been 2 weeks since she spotted and she began taking her ASA 81 mgm on 1 July.  FHT 168 with DT Pelvic exam: External/BUS: no lesions or inflammation Vagina: scant brown blood Cervix: closed Wet prep: negative Trich, slue cells or hyphae. A: spotting in the second trimester P: Stop ASA until no bleeding x 1 week Recommend no intercourse RTO if bleeding fills a pad or is accompanied by pain.  Evelyn Wright, CNM

## 2018-06-18 NOTE — Telephone Encounter (Signed)
Pt reports she has a bicornuate uterus. She had stopped spotting since her last vist, but she started taking a baby aspirin 06/11/18. She got upset and some things at work today & was crying a lot. She just noticed the bleeding ~10 mins ago. She isn't cramping & bleeding to the point of needed a pad. Pt inquiring if she needs to be assessed or keep an eye on it. Cb#[phone]

## 2018-07-04 ENCOUNTER — Ambulatory Visit (INDEPENDENT_AMBULATORY_CARE_PROVIDER_SITE_OTHER): Payer: BLUE CROSS/BLUE SHIELD | Admitting: Certified Nurse Midwife

## 2018-07-04 VITALS — BP 110/70 | Wt 281.0 lb

## 2018-07-04 DIAGNOSIS — Z3A16 16 weeks gestation of pregnancy: Secondary | ICD-10-CM

## 2018-07-04 DIAGNOSIS — Z8279 Family history of other congenital malformations, deformations and chromosomal abnormalities: Secondary | ICD-10-CM

## 2018-07-04 DIAGNOSIS — Z368A Encounter for antenatal screening for other genetic defects: Secondary | ICD-10-CM

## 2018-07-04 DIAGNOSIS — O0990 Supervision of high risk pregnancy, unspecified, unspecified trimester: Secondary | ICD-10-CM

## 2018-07-04 NOTE — Progress Notes (Signed)
No concerns.rj 

## 2018-07-07 LAB — AFP, SERUM, OPEN SPINA BIFIDA
AFP MOM: 0.71
AFP Value: 19.7 ng/mL
Gest. Age on Collection Date: 16.9 weeks
Maternal Age At EDD: 36.7 yr
OSBR RISK 1 IN: 10000
Test Results:: NEGATIVE
WEIGHT: 281 [lb_av]

## 2018-07-09 ENCOUNTER — Encounter (INDEPENDENT_AMBULATORY_CARE_PROVIDER_SITE_OTHER): Payer: Self-pay

## 2018-07-14 DIAGNOSIS — Z8279 Family history of other congenital malformations, deformations and chromosomal abnormalities: Secondary | ICD-10-CM | POA: Insufficient documentation

## 2018-07-14 NOTE — Progress Notes (Signed)
HROB at 16wk6 days: No further spotting after 06/18/18. Restarted ASA 81 mgm Advised that anesthesia does not require consult, but that if BMI 50 or more,( 292#) would need to transfer care Desires MSAFP today Anatomy scan and ROB in 3 weeks. Needs fetal echo around 24 weeks  Farrel Connersolleen Rigoberto Repass, CNM

## 2018-07-16 ENCOUNTER — Ambulatory Visit: Payer: BLUE CROSS/BLUE SHIELD | Admitting: Dietician

## 2018-07-25 ENCOUNTER — Ambulatory Visit (INDEPENDENT_AMBULATORY_CARE_PROVIDER_SITE_OTHER): Payer: BLUE CROSS/BLUE SHIELD | Admitting: Certified Nurse Midwife

## 2018-07-25 ENCOUNTER — Ambulatory Visit (INDEPENDENT_AMBULATORY_CARE_PROVIDER_SITE_OTHER): Payer: BLUE CROSS/BLUE SHIELD

## 2018-07-25 VITALS — BP 110/70 | Wt 286.0 lb

## 2018-07-25 DIAGNOSIS — O0990 Supervision of high risk pregnancy, unspecified, unspecified trimester: Secondary | ICD-10-CM

## 2018-07-25 DIAGNOSIS — Z363 Encounter for antenatal screening for malformations: Secondary | ICD-10-CM | POA: Diagnosis not present

## 2018-07-25 DIAGNOSIS — Z8279 Family history of other congenital malformations, deformations and chromosomal abnormalities: Secondary | ICD-10-CM

## 2018-07-25 DIAGNOSIS — O09521 Supervision of elderly multigravida, first trimester: Secondary | ICD-10-CM

## 2018-07-25 DIAGNOSIS — O09522 Supervision of elderly multigravida, second trimester: Secondary | ICD-10-CM

## 2018-07-25 DIAGNOSIS — Z3A19 19 weeks gestation of pregnancy: Secondary | ICD-10-CM

## 2018-07-25 DIAGNOSIS — Z6841 Body Mass Index (BMI) 40.0 and over, adult: Secondary | ICD-10-CM

## 2018-07-25 LAB — POCT URINALYSIS DIPSTICK OB
Glucose, UA: NEGATIVE — AB
PROTEIN: NEGATIVE

## 2018-07-25 NOTE — Progress Notes (Signed)
HROB and anatomy scan at 19wk6days: Weight up 5# to 286 (still below 291#) Anatomy scan incomplete: CGA 19wk6days/ transverse/ posterior placenta. Incomplete anatomy due to fetal position and maternal habitus Feeling FM [facility] [place] ROB in 4 weeks.  Farrel Connersolleen Litzy Dicker, CNM

## 2018-07-25 NOTE — Progress Notes (Signed)
No concerns.rj 

## 2018-07-26 ENCOUNTER — Other Ambulatory Visit: Payer: Self-pay | Admitting: Certified Nurse Midwife

## 2018-07-26 DIAGNOSIS — O09521 Supervision of elderly multigravida, first trimester: Secondary | ICD-10-CM

## 2018-07-26 DIAGNOSIS — Z363 Encounter for antenatal screening for malformations: Secondary | ICD-10-CM

## 2018-07-26 DIAGNOSIS — Z8279 Family history of other congenital malformations, deformations and chromosomal abnormalities: Secondary | ICD-10-CM

## 2018-07-26 DIAGNOSIS — Z6841 Body Mass Index (BMI) 40.0 and over, adult: Secondary | ICD-10-CM

## 2018-07-27 ENCOUNTER — Encounter: Payer: Self-pay | Admitting: Dietician

## 2018-07-27 NOTE — Progress Notes (Signed)
Have not heard back from patient to reschedule her cancelled appointment from 07/16/18. Sent letter to referring provider.

## 2018-08-06 ENCOUNTER — Ambulatory Visit (HOSPITAL_BASED_OUTPATIENT_CLINIC_OR_DEPARTMENT_OTHER)
Admission: RE | Admit: 2018-08-06 | Discharge: 2018-08-06 | Disposition: A | Discharge status: Home / Self Care | Payer: BLUE CROSS/BLUE SHIELD | Source: Ambulatory Visit | Attending: Certified Nurse Midwife | Admitting: Certified Nurse Midwife

## 2018-08-06 ENCOUNTER — Ambulatory Visit
Admission: RE | Admit: 2018-08-06 | Discharge: 2018-08-06 | Disposition: A | Discharge status: Home / Self Care | Payer: BLUE CROSS/BLUE SHIELD | Source: Ambulatory Visit | Attending: Obstetrics and Gynecology | Admitting: Obstetrics and Gynecology

## 2018-08-06 ENCOUNTER — Other Ambulatory Visit: Payer: BLUE CROSS/BLUE SHIELD

## 2018-08-06 VITALS — BP 118/76 | HR 87 | Temp 98.0°F | Resp 18 | Ht 64.0 in | Wt 292.0 lb

## 2018-08-06 DIAGNOSIS — Z8279 Family history of other congenital malformations, deformations and chromosomal abnormalities: Secondary | ICD-10-CM | POA: Diagnosis not present

## 2018-08-06 DIAGNOSIS — O09299 Supervision of pregnancy with other poor reproductive or obstetric history, unspecified trimester: Secondary | ICD-10-CM | POA: Diagnosis not present

## 2018-08-06 DIAGNOSIS — Z6841 Body Mass Index (BMI) 40.0 and over, adult: Secondary | ICD-10-CM | POA: Diagnosis not present

## 2018-08-06 DIAGNOSIS — Z3A21 21 weeks gestation of pregnancy: Secondary | ICD-10-CM | POA: Insufficient documentation

## 2018-08-06 DIAGNOSIS — O99212 Obesity complicating pregnancy, second trimester: Secondary | ICD-10-CM | POA: Diagnosis present

## 2018-08-06 DIAGNOSIS — O09521 Supervision of elderly multigravida, first trimester: Secondary | ICD-10-CM | POA: Diagnosis not present

## 2018-08-06 DIAGNOSIS — O0990 Supervision of high risk pregnancy, unspecified, unspecified trimester: Secondary | ICD-10-CM

## 2018-08-06 DIAGNOSIS — Z363 Encounter for antenatal screening for malformations: Secondary | ICD-10-CM

## 2018-08-06 DIAGNOSIS — Z98891 History of uterine scar from previous surgery: Secondary | ICD-10-CM

## 2018-08-06 DIAGNOSIS — O99211 Obesity complicating pregnancy, first trimester: Secondary | ICD-10-CM

## 2018-08-06 NOTE — Progress Notes (Signed)
[facility] Consultation   Chief Complaint: pregnant with BMI > 50   HPI: [name] is a 37 y.o. [id number] at [redacted]w[redacted]d [facility]  Pt notes she has been large her whole life . She has tried dieting in the past  She note sher last baby is 142yo (he was fathered by another man) -. 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 Pt denies OSA sxs . She has had no co morbidities. She had a baseline EKG in 2016. She has a h/o 4 sABs - there was a report of a bicornuate uterus  On review of her 04/25/18 images this appears to be the case .  She doesn't recall why she had a cesarean for her 37yo she did have preeclampsia .   Past Medical History: Patient  has no past medical history on file.  Past Surgical History: She  has a past surgical history that includes Cesarean section; Knee arthroscopy (Right); Dilation and evacuation (N/A, 09/22/2015); and Dilation and curettage of uterus.  Obstetric History:  OB History    Gravida  6   Para  1   Term  1   Preterm      AB  4   Living  1     SAB  2   TAB  2   Ectopic      Multiple      Live Births  1          Gynecologic History:  Patient's last menstrual period was 03/08/2018 (exact date).    Medications:  Current Outpatient Medications:  .  aspirin EC 81 MG tablet, Take 81 mg by mouth daily., Disp: , Rfl:  .  folic acid ([employer] FOLIC ACID) 8184MCG tablet, , Disp: , Rfl:  .  Omega-3 1000 MG CAPS, , Disp: , Rfl:  .  Prenatal Vit-Fe Fumarate-FA (PRENATAL VITAMIN PO), Take by mouth., Disp: , Rfl:  Allergies: Patient has No Known Allergies.  Social History: Patient  reports that she has never smoked. She has never used smokeless tobacco. She reports that she does not drink alcohol or use drugs.  Family History: family history includes Epilepsy in her sister.  Review of Systems A  full 12 point review of systems was negative or as noted in the History of Present Illness.  Physical Exam: BP 118/76   Pulse 87   Temp 98 F (36.7 C)   Resp 18   Ht _0  (1.626 m)   Wt 132.5 kg   LMP 03/08/2018 (Exact Date)   SpO2 98%   BMI 50.12 kg/m    Asessement: IUP at 21 4/7 weeks Advanced maternal age - normal mat 21 and scan today Obesity BMI over 50 normal hgb a1c and normal TSH , no sxs of OSA , has baseline EKG  Prior cesarean  Prior preeclampsia on aspirin Family h/o CHD - Father of baby - fetal echo scheduled     Plan: Fetal echo scheduled 08/16/18  F/u scan in 4 weeks for growth [facility]  Pt is planning ERLTCS .  I ordered an echo . We discussed prophylactic lovenox postpartum    Total time spent with the patient was 30 minutes with greater than 50% spent  in counseling and coordination of care. We appreciate this interesting consult and will be happy to be involved in the ongoing care of [name] in anyway her obstetricians desire. Gatha Mayer, [facility]

## 2018-08-06 NOTE — Discharge Instructions (Signed)
Maternal Echo scheduled for Tuesday September 10 at 10am.  Pt contacted via phone [phone] and we discussed this appointment.  Pt verb u/o.

## 2018-08-06 NOTE — Addendum Note (Signed)
Encounter addended by: Jamison OkaSmith, Janmarie Smoot R, RN on: 08/06/2018 2:31 PM  Actions taken: [facility]

## 2018-08-16 DIAGNOSIS — O283 Abnormal ultrasonic finding on antenatal screening of mother: Secondary | ICD-10-CM | POA: Insufficient documentation

## 2018-08-21 ENCOUNTER — Ambulatory Visit
Admission: RE | Admit: 2018-08-21 | Discharge: 2018-08-21 | Disposition: A | Discharge status: Home / Self Care | Payer: BLUE CROSS/BLUE SHIELD | Source: Ambulatory Visit | Attending: Obstetrics and Gynecology | Admitting: Obstetrics and Gynecology

## 2018-08-21 DIAGNOSIS — Z6841 Body Mass Index (BMI) 40.0 and over, adult: Secondary | ICD-10-CM | POA: Insufficient documentation

## 2018-08-21 NOTE — Progress Notes (Signed)
*  PRELIMINARY RESULTS* Echocardiogram 2D Echocardiogram has been performed.  Cristela Blue 08/21/2018, 12:13 PM

## 2018-08-22 ENCOUNTER — Encounter: Payer: Self-pay | Admitting: Maternal Newborn

## 2018-08-22 ENCOUNTER — Ambulatory Visit (INDEPENDENT_AMBULATORY_CARE_PROVIDER_SITE_OTHER): Payer: BLUE CROSS/BLUE SHIELD | Admitting: Maternal Newborn

## 2018-08-22 VITALS — BP 114/62 | Wt 299.0 lb

## 2018-08-22 DIAGNOSIS — Z23 Encounter for immunization: Secondary | ICD-10-CM | POA: Diagnosis not present

## 2018-08-22 DIAGNOSIS — O0990 Supervision of high risk pregnancy, unspecified, unspecified trimester: Secondary | ICD-10-CM

## 2018-08-22 DIAGNOSIS — Z3A23 23 weeks gestation of pregnancy: Secondary | ICD-10-CM

## 2018-08-22 DIAGNOSIS — O09522 Supervision of elderly multigravida, second trimester: Secondary | ICD-10-CM

## 2018-08-22 NOTE — Progress Notes (Signed)
ROB No concerns  Flu shot today  

## 2018-08-22 NOTE — Progress Notes (Signed)
Routine Prenatal Care Visit  Subjective  Evelyn Wright is a 37 y.o. [id number] at [redacted]w[redacted]d being seen today for ongoing prenatal care.  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  ----------------------------------------------------------------------------------- Patient reports no complaints.   Vag. Bleeding: None.  Movement: Present. No leaking of fluid.  ----------------------------------------------------------------------------------- The following portions of the patient's history were reviewed and updated as appropriate: allergies, current medications, past family history, past medical history, past social history, past surgical history and problem list. Problem list updated.  Objective  Blood pressure 114/62, weight 299 lb (135.6 kg), last menstrual period 03/08/2018. Pregravid weight 270 lb (122.5 kg) Total Weight Gain 29 lb (13.2 kg) Body mass index is 51.32 kg/m.  Fetal Status: Fetal Heart Rate (bpm): Present Fundal Height: 26 cm Movement: Present    FHT difficult to appreciate, but did show on Doppler in 160s.  General:  Alert, oriented and cooperative. Patient is in no acute distress.  Skin: Skin is warm and dry. No rash noted.   Cardiovascular: Normal heart rate noted  Respiratory: Normal respiratory effort, no problems with respiration noted  Abdomen: Soft,  gravid, appropriate for gestational age. Pain/Pressure: Absent     Pelvic:  Cervical exam deferred        Extremities: Normal range of motion.  Edema: Trace  Mental Status: Normal mood and affect. Normal behavior. Normal judgment and thought content.     Assessment   37 y.o. [id number] at [redacted]w[redacted]d, EDD 12/13/2018 by Last Menstrual Period presenting for routine prenatal visit.  Plan   pregnancy Problems (from 04/04/18 to present)    Problem Noted Resolved   Supervision of high risk pregnancy, antepartum 04/04/2018 by Conard Novak, MD No   Overview Addendum 08/07/2018  9:24 AM by Farrel Conners, CNM      [facility] Prenatal Labs  Dating  Blood type: B/Positive/-- (04/24 1053)   Genetic Screen 1 Screen:     MSAFP:    neg  Quad:      NIPS:  Diploid XY  Antibody:Negative (04/24 1053)  Anatomic [place] Normal but incomplete at [facility] Rubella: 1.98 (04/24 1053) Varicella: Nonimmune  GTT Early: 69       28 wk:      RPR: Non Reactive (04/24 1053)   Rhogam positive HBsAg: Negative (04/24 1053)   TDaP vaccine                       HIV: Non Reactive (04/24 1053)   Flu Shot                                GBS:   Contraception  Pap:  CBB     CS/VBAC    Baby Food    Support Person             Obesity affecting pregnancy 04/04/2018 by Conard Novak, MD No   Morbid obesity with BMI  of 50.0-59.9, adult (HCC) 04/04/2018 by Conard Novak, MD No   Advanced maternal age in multigravida 04/04/2018 by Conard Novak, MD No   First trimester bleeding 04/04/2018 by Conard Novak, MD No   Pregnancy complicated by previous recurrent miscarriages 04/04/2018 by Conard Novak, MD No    We discussed whether she would be transferring care, will talk with MD about current anesthesia policy before final decision.  Gestational age appropriate obstetric precautions were reviewed.  Please refer to After Visit Summary for other counseling recommendations.   Return in about 4 weeks  (around 09/19/2018) for ROB and 28 week labs/GTT.  Marcelyn Bruins, CNM 08/22/2018  10:49 AM

## 2018-08-22 NOTE — Patient Instructions (Signed)

## 2018-08-31 ENCOUNTER — Telehealth: Payer: Self-pay

## 2018-08-31 ENCOUNTER — Other Ambulatory Visit: Payer: Self-pay | Admitting: Advanced Practice Midwife

## 2018-08-31 DIAGNOSIS — O219 Vomiting of pregnancy, unspecified: Secondary | ICD-10-CM

## 2018-08-31 MED ORDER — ONDANSETRON 4 MG PO TBDP: 4.0000 mg | ORAL_TABLET | Freq: Four times a day (QID) | ORAL | 0 refills | Status: DC | PRN

## 2018-08-31 NOTE — Telephone Encounter (Signed)
Pt aware.

## 2018-08-31 NOTE — Telephone Encounter (Signed)
Prescription for zofran sent to patient's pharmacy. Please let her know. Thank you

## 2018-08-31 NOTE — Telephone Encounter (Signed)
Pt called reports sore throat head ache and vomiting, pt wants someone to send something in for nausea, please advise

## 2018-08-31 NOTE — Progress Notes (Signed)
Rx sent to patient pharmacy per request. 

## 2018-09-03 ENCOUNTER — Ambulatory Visit
Admission: RE | Admit: 2018-09-03 | Discharge: 2018-09-03 | Disposition: A | Discharge status: Home / Self Care | Payer: BLUE CROSS/BLUE SHIELD | Source: Ambulatory Visit | Attending: Obstetrics & Gynecology | Admitting: Obstetrics & Gynecology

## 2018-09-03 DIAGNOSIS — O09522 Supervision of elderly multigravida, second trimester: Secondary | ICD-10-CM | POA: Insufficient documentation

## 2018-09-03 DIAGNOSIS — Z6841 Body Mass Index (BMI) 40.0 and over, adult: Secondary | ICD-10-CM

## 2018-09-03 DIAGNOSIS — O99212 Obesity complicating pregnancy, second trimester: Secondary | ICD-10-CM | POA: Diagnosis present

## 2018-09-03 DIAGNOSIS — O09299 Supervision of pregnancy with other poor reproductive or obstetric history, unspecified trimester: Secondary | ICD-10-CM

## 2018-09-03 DIAGNOSIS — O09292 Supervision of pregnancy with other poor reproductive or obstetric history, second trimester: Secondary | ICD-10-CM | POA: Diagnosis not present

## 2018-09-03 DIAGNOSIS — Z3A25 25 weeks gestation of pregnancy: Secondary | ICD-10-CM | POA: Insufficient documentation

## 2018-09-03 DIAGNOSIS — O34219 Maternal care for unspecified type scar from previous cesarean delivery: Secondary | ICD-10-CM | POA: Insufficient documentation

## 2018-09-03 DIAGNOSIS — O2621 Pregnancy care for patient with recurrent pregnancy loss, first trimester: Secondary | ICD-10-CM

## 2018-09-03 DIAGNOSIS — O0990 Supervision of high risk pregnancy, unspecified, unspecified trimester: Secondary | ICD-10-CM

## 2018-09-03 DIAGNOSIS — Z8279 Family history of other congenital malformations, deformations and chromosomal abnormalities: Secondary | ICD-10-CM | POA: Diagnosis not present

## 2018-09-03 DIAGNOSIS — Z98891 History of uterine scar from previous surgery: Secondary | ICD-10-CM

## 2018-09-03 DIAGNOSIS — O99211 Obesity complicating pregnancy, first trimester: Secondary | ICD-10-CM

## 2018-09-03 DIAGNOSIS — O209 Hemorrhage in early pregnancy, unspecified: Secondary | ICD-10-CM

## 2018-09-19 ENCOUNTER — Other Ambulatory Visit: Payer: BLUE CROSS/BLUE SHIELD

## 2018-09-19 ENCOUNTER — Ambulatory Visit (INDEPENDENT_AMBULATORY_CARE_PROVIDER_SITE_OTHER): Payer: BLUE CROSS/BLUE SHIELD | Admitting: Maternal Newborn

## 2018-09-19 ENCOUNTER — Encounter: Payer: Self-pay | Admitting: Maternal Newborn

## 2018-09-19 VITALS — BP 110/60 | Wt 308.5 lb

## 2018-09-19 DIAGNOSIS — O0990 Supervision of high risk pregnancy, unspecified, unspecified trimester: Secondary | ICD-10-CM

## 2018-09-19 DIAGNOSIS — O99210 Obesity complicating pregnancy, unspecified trimester: Secondary | ICD-10-CM

## 2018-09-19 DIAGNOSIS — Z3A27 27 weeks gestation of pregnancy: Secondary | ICD-10-CM

## 2018-09-19 DIAGNOSIS — O99212 Obesity complicating pregnancy, second trimester: Secondary | ICD-10-CM

## 2018-09-19 DIAGNOSIS — R109 Unspecified abdominal pain: Secondary | ICD-10-CM

## 2018-09-19 DIAGNOSIS — R319 Hematuria, unspecified: Secondary | ICD-10-CM

## 2018-09-19 DIAGNOSIS — O9989 Other specified diseases and conditions complicating pregnancy, childbirth and the puerperium: Secondary | ICD-10-CM

## 2018-09-19 LAB — POCT URINALYSIS DIPSTICK OB: GLUCOSE, UA: NEGATIVE

## 2018-09-19 LAB — POCT URINALYSIS DIPSTICK
Bilirubin, UA: NEGATIVE
GLUCOSE UA: NEGATIVE
Ketones, UA: NEGATIVE
LEUKOCYTES UA: NEGATIVE
Nitrite, UA: NEGATIVE
PH UA: 6.5 (ref 5.0–8.0)
Protein, UA: POSITIVE — AB
RBC UA: POSITIVE
SPEC GRAV UA: 1.02 (ref 1.010–1.025)
UROBILINOGEN UA: NEGATIVE U/dL — AB

## 2018-09-19 NOTE — Progress Notes (Signed)
C/o Pelvic pressure comes and goes; left hip bothers her; arms go numb.rj

## 2018-09-19 NOTE — Progress Notes (Signed)
Routine Prenatal Care Visit  Subjective  Evelyn Wright is a 37 y.o. G3T5176 at [redacted]w[redacted]d being seen today for ongoing prenatal care.  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  ----------------------------------------------------------------------------------- Patient reports carpal tunnel symptoms.  Also having some left hip pain and occasional pelvic pressure. Contractions: Not present. Vag. Bleeding: None.  Movement: Present. No leaking of fluid.  ----------------------------------------------------------------------------------- The following portions of the patient's history were reviewed and updated as appropriate: allergies, current medications, past family history, past medical history, past social history, past surgical history and problem list. Problem list updated.   Objective  Blood pressure 110/60, weight (!) 308 lb 8 oz (139.9 kg), last menstrual period 03/08/2018. Pregravid weight 270 lb (122.5 kg) Total Weight Gain 38 lb 8 oz (17.5 kg) Body mass index is 52.95 kg/m.   Urinalysis: Protein Trace, Glucose Negative  Fetal Status: Fetal Heart Rate (bpm): Present   Movement: Present     General:  Alert, oriented and cooperative. Patient is in no acute distress.  Skin: Skin is warm and dry. No rash noted.   Cardiovascular: Normal heart rate noted    Respiratory: Normal respiratory effort, no problems with respiration noted  Abdomen: Soft, gravid, appropriate for gestational age. Pain/Pressure: Present     Pelvic:  Cervical exam deferred        Extremities: Normal range of motion.  Edema: None  Mental Status: Normal mood and affect. Normal behavior. Normal judgment and thought content.     Assessment   37 y.o. [id number] at [redacted]w[redacted]d, EDD 12/13/2018 by Last Menstrual Period presenting for a routine prenatal visit.  Plan   pregnancy Problems (from 04/04/18 to present)    Problem Noted Resolved   Supervision of high risk pregnancy, antepartum 04/04/2018 by Conard Novak, MD No   Overview Addendum 08/22/2018 11:44 AM by Oswaldo Conroy, CNM      [facility] Prenatal Labs  Dating  Blood type: B/Positive/-- (04/24 1053)   Genetic Screen 1 Screen:     MSAFP:    neg  Quad:      NIPS:  Diploid XY  Antibody:Negative (04/24 1053)  Anatomic [place] Normal but incomplete at [facility] Rubella: 1.98 (04/24 1053) Varicella: Nonimmune  GTT Early: 69       28 wk:      RPR: Non Reactive (04/24 1053)   Rhogam positive HBsAg: Negative (04/24 1053)   TDaP vaccine                       HIV: Non Reactive (04/24 1053)   Flu Shot  08/22/2018                              GBS:   Contraception  Pap:  CBB     CS/VBAC    Baby Food    Support Person             Obesity  affecting pregnancy 04/04/2018 by Conard Novak, MD No   Morbid obesity with BMI of 50.0-59.9, adult (HCC) 04/04/2018 by Conard Novak, MD No   Advanced maternal age in multigravida 04/04/2018 by Conard Novak, MD No   First trimester bleeding 04/04/2018 by Conard Novak, MD No   Pregnancy complicated by previous recurrent miscarriages 04/04/2018 by Conard Novak, MD No      Urine culture sent for symptoms of pressure and blood on UA. She has not noticed any vaginal bleeding/spotting.  Recommended wrist braces to help with carpal tunnel symptoms, especially to wear  when sleeping.  Discussed anesthesia policy and consult at 35-[redacted] weeks gestation.  Ultrasound done at [facility] on 9/23. EFW was at the 40th percentile and fetal position was transverse. Some anatomy not visualized. She will have a follow up ultrasound at [facility] in 2 weeks.   Please refer to After Visit Summary for other counseling recommendations.   Return in about 2 weeks (around 10/03/2018) for ROB.  Marcelyn Bruins, CNM 09/19/2018  9:56 AM

## 2018-09-19 NOTE — Patient Instructions (Signed)
Third Trimester of Pregnancy The third trimester is from week 28 through week 40 (months 7 through 9). The third trimester is a time when the unborn baby (fetus) is growing rapidly. At the end of the ninth month, the fetus is about 20 inches in length and weighs 6-10 pounds. Body changes during your third trimester Your body will continue to go through many changes during pregnancy. The changes vary from woman to woman. During the third trimester:  Your weight will continue to increase. You can expect to gain 25-35 pounds (11-16 kg) by the end of the pregnancy.  You may begin to get stretch marks on your hips, abdomen, and breasts.  You may urinate more often because the fetus is moving lower into your pelvis and pressing on your bladder.  You may develop or continue to have heartburn. This is caused by increased hormones that slow down muscles in the digestive tract.  You may develop or continue to have constipation because increased hormones slow digestion and cause the muscles that push waste through your intestines to relax.  You may develop hemorrhoids. These are swollen veins (varicose veins) in the rectum that can itch or be painful.  You may develop swollen, bulging veins (varicose veins) in your legs.  You may have increased body aches in the pelvis, back, or thighs. This is due to weight gain and increased hormones that are relaxing your joints.  You may have changes in your hair. These can include thickening of your hair, rapid growth, and changes in texture. Some women also have hair loss during or after pregnancy, or hair that feels dry or thin. Your hair will most likely return to normal after your baby is born.  Your breasts will continue to grow and they will continue to become tender. A yellow fluid (colostrum) may leak from your breasts. This is the first milk you are producing for your baby.  Your belly button may stick out.  You may notice more swelling in your hands,  face, or ankles.  You may have increased tingling or numbness in your hands, arms, and legs. The skin on your belly may also feel numb.  You may feel short of breath because of your expanding uterus.  You may have more problems sleeping. This can be caused by the size of your belly, increased need to urinate, and an increase in your body's metabolism.  You may notice the fetus "dropping," or moving lower in your abdomen (lightening).  You may have increased vaginal discharge.  You may notice your joints feel loose and you may have pain around your pelvic bone.  What to expect at prenatal visits You will have prenatal exams every 2 weeks until week 36. Then you will have weekly prenatal exams. During a routine prenatal visit:  You will be weighed to make sure you and the baby are growing normally.  Your blood pressure will be taken.  Your abdomen will be measured to track your baby's growth.  The fetal heartbeat will be listened to.  Any test results from the previous visit will be discussed.  You may have a cervical check near your due date to see if your cervix has softened or thinned (effaced).  You will be tested for Group B streptococcus. This happens between 35 and 37 weeks.  Your health care provider may ask you:  What your birth plan is.  How you are feeling.  If you are feeling the baby move.  If you have had   any abnormal symptoms, such as leaking fluid, bleeding, severe headaches, or abdominal cramping.  If you are using any tobacco products, including cigarettes, chewing tobacco, and electronic cigarettes.  If you have any questions.  Other tests or screenings that may be performed during your third trimester include:  Blood tests that check for low iron levels (anemia).  Fetal testing to check the health, activity level, and growth of the fetus. Testing is done if you have certain medical conditions or if there are problems during the  pregnancy.  Nonstress test (NST). This test checks the health of your baby to make sure there are no signs of problems, such as the baby not getting enough oxygen. During this test, a belt is placed around your belly. The baby is made to move, and its heart rate is monitored during movement.  What is false labor? False labor is a condition in which you feel small, irregular tightenings of the muscles in the womb (contractions) that usually go away with rest, changing position, or drinking water. These are called Braxton Hicks contractions. Contractions may last for hours, days, or even weeks before true labor sets in. If contractions come at regular intervals, become more frequent, increase in intensity, or become painful, you should see your health care provider. What are the signs of labor?  Abdominal cramps.  Regular contractions that start at 10 minutes apart and become stronger and more frequent with time.  Contractions that start on the top of the uterus and spread down to the lower abdomen and back.  Increased pelvic pressure and dull back pain.  A watery or bloody mucus discharge that comes from the vagina.  Leaking of amniotic fluid. This is also known as your "water breaking." It could be a slow trickle or a gush. Let your health care provider know if it has a color or strange odor. If you have any of these signs, call your health care provider right away, even if it is before your due date. Follow these instructions at home: Medicines  Follow your health care provider's instructions regarding medicine use. Specific medicines may be either safe or unsafe to take during pregnancy.  Take a prenatal vitamin that contains at least 600 micrograms (mcg) of folic acid.  If you develop constipation, try taking a stool softener if your health care provider approves. Eating and drinking  Eat a balanced diet that includes fresh fruits and vegetables, whole grains, good sources of protein  such as meat, eggs, or tofu, and low-fat dairy. Your health care provider will help you determine the amount of weight gain that is right for you.  Avoid raw meat and uncooked cheese. These carry germs that can cause birth defects in the baby.  If you have low calcium intake from food, talk to your health care provider about whether you should take a daily calcium supplement.  Eat four or five small meals rather than three large meals a day.  Limit foods that are high in fat and processed sugars, such as fried and sweet foods.  To prevent constipation: ? Drink enough fluid to keep your urine clear or pale yellow. ? Eat foods that are high in fiber, such as fresh fruits and vegetables, whole grains, and beans. Activity  Exercise only as directed by your health care provider. Most women can continue their usual exercise routine during pregnancy. Try to exercise for 30 minutes at least 5 days a week. Stop exercising if you experience uterine contractions.  Avoid heavy   lifting.  Do not exercise in extreme heat or humidity, or at high altitudes.  Wear low-heel, comfortable shoes.  Practice good posture.  You may continue to have sex unless your health care provider tells you otherwise. Relieving pain and discomfort  Take frequent breaks and rest with your legs elevated if you have leg cramps or low back pain.  Take warm sitz baths to soothe any pain or discomfort caused by hemorrhoids. Use hemorrhoid cream if your health care provider approves.  Wear a good support bra to prevent discomfort from breast tenderness.  If you develop varicose veins: ? Wear support pantyhose or compression stockings as told by your healthcare provider. ? Elevate your feet for 15 minutes, 3-4 times a day. Prenatal care  Write down your questions. Take them to your prenatal visits.  Keep all your prenatal visits as told by your health care provider. This is important. Safety  Wear your seat belt at  all times when driving.  Make a list of emergency phone numbers, including numbers for family, friends, the hospital, and police and fire departments. General instructions  Avoid cat litter boxes and soil used by cats. These carry germs that can cause birth defects in the baby. If you have a cat, ask someone to clean the litter box for you.  Do not travel far distances unless it is absolutely necessary and only with the approval of your health care provider.  Do not use hot tubs, steam rooms, or saunas.  Do not drink alcohol.  Do not use any products that contain nicotine or tobacco, such as cigarettes and e-cigarettes. If you need help quitting, ask your health care provider.  Do not use any medicinal herbs or unprescribed drugs. These chemicals affect the formation and growth of the baby.  Do not douche or use tampons or scented sanitary pads.  Do not cross your legs for long periods of time.  To prepare for the arrival of your baby: ? Take prenatal classes to understand, practice, and ask questions about labor and delivery. ? Make a trial run to the hospital. ? Visit the hospital and tour the maternity area. ? Arrange for maternity or paternity leave through employers. ? Arrange for family and friends to take care of pets while you are in the hospital. ? Purchase a rear-facing car seat and make sure you know how to install it in your car. ? Pack your hospital bag. ? Prepare the baby's nursery. Make sure to remove all pillows and stuffed animals from the baby's crib to prevent suffocation.  Visit your dentist if you have not gone during your pregnancy. Use a soft toothbrush to brush your teeth and be gentle when you floss. Contact a health care provider if:  You are unsure if you are in labor or if your water has broken.  You become dizzy.  You have mild pelvic cramps, pelvic pressure, or nagging pain in your abdominal area.  You have lower back pain.  You have persistent  nausea, vomiting, or diarrhea.  You have an unusual or bad smelling vaginal discharge.  You have pain when you urinate. Get help right away if:  Your water breaks before 37 weeks.  You have regular contractions less than 5 minutes apart before 37 weeks.  You have a fever.  You are leaking fluid from your vagina.  You have spotting or bleeding from your vagina.  You have severe abdominal pain or cramping.  You have rapid weight loss or weight gain.    You have shortness of breath with chest pain.  You notice sudden or extreme swelling of your face, hands, ankles, feet, or legs.  Your baby makes fewer than 10 movements in 2 hours.  You have severe headaches that do not go away when you take medicine.  You have vision changes. Summary  The third trimester is from week 28 through week 40, months 7 through 9. The third trimester is a time when the unborn baby (fetus) is growing rapidly.  During the third trimester, your discomfort may increase as you and your baby continue to gain weight. You may have abdominal, leg, and back pain, sleeping problems, and an increased need to urinate.  During the third trimester your breasts will keep growing and they will continue to become tender. A yellow fluid (colostrum) may leak from your breasts. This is the first milk you are producing for your baby.  False labor is a condition in which you feel small, irregular tightenings of the muscles in the womb (contractions) that eventually go away. These are called Braxton Hicks contractions. Contractions may last for hours, days, or even weeks before true labor sets in.  Signs of labor can include: abdominal cramps; regular contractions that start at 10 minutes apart and become stronger and more frequent with time; watery or bloody mucus discharge that comes from the vagina; increased pelvic pressure and dull back pain; and leaking of amniotic fluid. This information is not intended to replace advice  given to you by your health care provider. Make sure you discuss any questions you have with your health care provider. Document Released: 11/22/2001 Document Revised: 05/05/2016 Document Reviewed: 01/29/2013 Elsevier Interactive Patient Education  2017 [employer]  

## 2018-09-20 LAB — 28 WEEK RH+PANEL
BASOS ABS: 0 10*3/uL (ref 0.0–0.2)
BASOS: 0 %
EOS (ABSOLUTE): 0.1 10*3/uL (ref 0.0–0.4)
EOS: 1 %
Gestational Diabetes Screen: 51 mg/dL — ABNORMAL LOW (ref 65–139)
HIV SCREEN 4TH GENERATION: NONREACTIVE
Hematocrit: 34.3 % (ref 34.0–46.6)
Hemoglobin: 11.2 g/dL (ref 11.1–15.9)
IMMATURE GRANULOCYTES: 1 %
Immature Grans (Abs): 0.1 10*3/uL (ref 0.0–0.1)
Lymphocytes Absolute: 2.2 10*3/uL (ref 0.7–3.1)
Lymphs: 21 %
MCH: 28.3 pg (ref 26.6–33.0)
MCHC: 32.7 g/dL (ref 31.5–35.7)
MCV: 87 fL (ref 79–97)
MONOCYTES: 5 %
Monocytes Absolute: 0.5 10*3/uL (ref 0.1–0.9)
Neutrophils Absolute: 7.7 10*3/uL — ABNORMAL HIGH (ref 1.4–7.0)
Neutrophils: 72 %
PLATELETS: 251 10*3/uL (ref 150–450)
RBC: 3.96 x10E6/uL (ref 3.77–5.28)
RDW: 12.5 % (ref 12.3–15.4)
RPR Ser Ql: NONREACTIVE
WBC: 10.5 10*3/uL (ref 3.4–10.8)

## 2018-09-21 LAB — URINE CULTURE

## 2018-10-01 ENCOUNTER — Ambulatory Visit
Admission: RE | Admit: 2018-10-01 | Discharge: 2018-10-01 | Disposition: A | Discharge status: Home / Self Care | Payer: BLUE CROSS/BLUE SHIELD | Source: Ambulatory Visit | Attending: Obstetrics & Gynecology | Admitting: Obstetrics & Gynecology

## 2018-10-01 DIAGNOSIS — Z3A29 29 weeks gestation of pregnancy: Secondary | ICD-10-CM | POA: Diagnosis not present

## 2018-10-01 DIAGNOSIS — O99213 Obesity complicating pregnancy, third trimester: Secondary | ICD-10-CM | POA: Diagnosis present

## 2018-10-01 DIAGNOSIS — Z98891 History of uterine scar from previous surgery: Secondary | ICD-10-CM

## 2018-10-01 DIAGNOSIS — Z6841 Body Mass Index (BMI) 40.0 and over, adult: Secondary | ICD-10-CM

## 2018-10-03 ENCOUNTER — Ambulatory Visit (INDEPENDENT_AMBULATORY_CARE_PROVIDER_SITE_OTHER): Payer: BLUE CROSS/BLUE SHIELD | Admitting: Advanced Practice Midwife

## 2018-10-03 ENCOUNTER — Encounter: Payer: Self-pay | Admitting: Advanced Practice Midwife

## 2018-10-03 VITALS — BP 112/58 | Wt 309.0 lb

## 2018-10-03 DIAGNOSIS — O09293 Supervision of pregnancy with other poor reproductive or obstetric history, third trimester: Secondary | ICD-10-CM

## 2018-10-03 DIAGNOSIS — O0990 Supervision of high risk pregnancy, unspecified, unspecified trimester: Secondary | ICD-10-CM

## 2018-10-03 DIAGNOSIS — Z23 Encounter for immunization: Secondary | ICD-10-CM

## 2018-10-03 DIAGNOSIS — O34219 Maternal care for unspecified type scar from previous cesarean delivery: Secondary | ICD-10-CM

## 2018-10-03 DIAGNOSIS — O09523 Supervision of elderly multigravida, third trimester: Secondary | ICD-10-CM

## 2018-10-03 DIAGNOSIS — O99213 Obesity complicating pregnancy, third trimester: Secondary | ICD-10-CM

## 2018-10-03 DIAGNOSIS — Z3A29 29 weeks gestation of pregnancy: Secondary | ICD-10-CM

## 2018-10-03 LAB — POCT URINALYSIS DIPSTICK OB
Glucose, UA: NEGATIVE
PROTEIN: NEGATIVE

## 2018-10-03 NOTE — Progress Notes (Signed)
ROB TDAP  

## 2018-10-03 NOTE — Addendum Note (Signed)
Addended by: Tresea Mall on: 10/03/2018 02:37 PM   Modules accepted: Orders

## 2018-10-03 NOTE — Progress Notes (Signed)
Routine Prenatal Care Visit  Subjective  Evelyn Wright is a 38 y.o. [id number] at [redacted]w[redacted]d being seen today for ongoing prenatal care.  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  ----------------------------------------------------------------------------------- Patient reports no complaints.  She desires delivery at [facility] and understands we will be transferring her to their care soon.  Contractions: Not present. Vag. Bleeding: None.  Movement: Present. Denies leaking of fluid.  ----------------------------------------------------------------------------------- The following portions of the patient's history were reviewed and updated as appropriate: allergies, current medications, past family history, past medical history, past social history, past surgical history and problem list. Problem list updated.   Objective  Blood pressure (!) 112/58, weight (!) 309 lb (140.2 kg), last menstrual period 03/08/2018 Pregravid weight 270 lb (122.5 kg) Total Weight Gain 39 lb (17.7 kg) Urinalysis: Urine Protein Negative  Urine Glucose Negative  Fetal Status: Fetal Heart Rate (bpm): 144   Movement: Present      Seen on 10/21 by MFM for growth scan. Fetal growth and AFI appropriate.  General:  Alert, oriented and cooperative. Patient is in no acute distress.  Skin: Skin is warm and dry. No  rash noted.   Cardiovascular: Normal heart rate noted  Respiratory: Normal respiratory effort, no problems with respiration noted  Abdomen: Soft, gravid, appropriate for gestational age. Pain/Pressure: Present     Pelvic:  Cervical exam deferred        Extremities: Normal range of motion.  Edema: None  Mental Status: Normal mood and affect. Normal behavior. Normal judgment and thought content.   Assessment   37 y.o. [id number] at [redacted]w[redacted]d by  12/13/2018, by Last Menstrual Period presenting for routine prenatal visit  Plan   pregnancy Problems (from 04/04/18 to present)    Problem Noted Resolved   Supervision of high risk pregnancy, antepartum 04/04/2018 by Conard Novak, MD No   Overview Addendum 08/22/2018 11:44 AM by Oswaldo Conroy, CNM      [facility]inic [facility] Prenatal Labs  Dating  Blood type: B/Positive/-- (04/24 1053)   Genetic Screen 1 Screen:     MSAFP:    neg  Quad:      NIPS:  Diploid XY  Antibody:Negative (04/24 1053)  Anatomic [place] Normal but incomplete at [facility] Rubella: 1.98 (04/24 1053) Varicella: Nonimmune  GTT Early: 69       28 wk:      RPR: Non Reactive (04/24 1053)   Rhogam positive HBsAg: Negative (04/24 1053)   TDaP vaccine                       HIV: Non Reactive (04/24 1053)   Flu Shot  08/22/2018                              GBS:   Contraception  Pap:  CBB     CS/VBAC    Baby Food    Support Person  Obesity affecting pregnancy 04/04/2018 by Conard Novak, MD No   Overview Signed 09/19/2018 10:00 AM by Oswaldo Conroy, CNM    BMI >=40 [ ]  early 1h gtt -  [ ]  u/s for dating [ ]   [ ]  nutritional goals [ ]  folic acid 1mg  [ ]  bASA (>12 weeks) [ ]  consider nutrition consult [ ]  consider maternal EKG 1st trimester [ ]  Growth u/s 28 [ ] , 32 [ ] , 36 weeks [ ]  [ ]  NST/AFI weekly 36+ weeks (36[] , 37[] , 38[] , 39[] , 40[] ) [ ]  IOL by 41 weeks (scheduled, prn [] )      Morbid obesity with BMI of 50.0-59.9, adult (HCC) 04/04/2018 by Conard Novak, MD No   Advanced maternal age in multigravida 04/04/2018 by Conard Novak, MD No   First trimester bleeding 04/04/2018 by Conard Novak, MD No   Pregnancy complicated by previous recurrent miscarriages 04/04/2018 by Conard Novak, MD No       Preterm labor symptoms and general obstetric precautions including but not limited to vaginal bleeding, contractions, leaking of fluid and fetal movement were reviewed in detail with the patient. Please refer to After Visit Summary for other counseling recommendations.   Return in about 2 weeks (around 10/17/2018) for rob.  Tresea Mall, CNM 10/03/2018 2:02 PM

## 2018-10-16 NOTE — Telephone Encounter (Signed)
I haven't seen this patient since June. Why am I getting this message? She recently saw Erskine Squibb. Erskine Squibb do you have insight into this? I think that she should be referred to orthopedics. I understand that with bad carpel tunnel they will do steroid injections.  Thank you, Dr. Jerene Pitch

## 2018-10-17 ENCOUNTER — Ambulatory Visit (INDEPENDENT_AMBULATORY_CARE_PROVIDER_SITE_OTHER): Payer: BLUE CROSS/BLUE SHIELD | Admitting: Obstetrics & Gynecology

## 2018-10-17 VITALS — BP 110/70 | Wt 315.0 lb

## 2018-10-17 DIAGNOSIS — O99210 Obesity complicating pregnancy, unspecified trimester: Secondary | ICD-10-CM

## 2018-10-17 DIAGNOSIS — Z3A31 31 weeks gestation of pregnancy: Secondary | ICD-10-CM

## 2018-10-17 DIAGNOSIS — Z6841 Body Mass Index (BMI) 40.0 and over, adult: Secondary | ICD-10-CM

## 2018-10-17 DIAGNOSIS — O99213 Obesity complicating pregnancy, third trimester: Secondary | ICD-10-CM

## 2018-10-17 DIAGNOSIS — O0990 Supervision of high risk pregnancy, unspecified, unspecified trimester: Secondary | ICD-10-CM

## 2018-10-17 LAB — POCT URINALYSIS DIPSTICK OB
GLUCOSE, UA: NEGATIVE
POC,PROTEIN,UA: NEGATIVE

## 2018-10-17 NOTE — Progress Notes (Signed)
Subjective  Fetal Movement? yes Contractions? no Leaking Fluid? no Vaginal Bleeding? no Swelling noted Objective  BP 110/70   Wt (!) 315 lb (142.9 kg)   LMP 03/08/2018 (Exact Date)   BMI 54.07 kg/m  General: NAD Pumonary: no increased work of breathing Abdomen: gravid, non-tender Extremities: no edema Psychiatric: mood appropriate, affect full  Assessment  37 y.o. [id number] at [redacted]w[redacted]d by  12/13/2018, by Last Menstrual Period presenting for routine prenatal visit  Plan   Problem List Items Addressed This Visit      Other   Supervision of high risk pregnancy, antepartum   Relevant Orders   US OB Follow Up   Ambulatory referral to Anesthesiology   Obesity affecting pregnancy   Relevant Orders   US OB Follow Up   Ambulatory referral to Anesthesiology    Other Visit Diagnoses    [redacted] weeks gestation of pregnancy    -  Primary   BMI 45.0-49.9, adult (HCC)       Relevant Orders   US OB Follow Up   Ambulatory referral to Anesthesiology    VBAC vs CS discussed, last CS was 17 years ago and for malposition    OB/GYN  Counseling Note  37 y.o. [id number] at [redacted]w[redacted]d with Estimated Date of Delivery: 12/13/18 was seen today in office to discuss trial of labor after cesarean section (TOLAC) versus elective repeat cesarean delivery (ERCD). The following risks were discussed with the patient.  Risk of uterine rupture at term is 0.78 percent with TOLAC and 0.22 percent with ERCD. 1 in 10 uterine ruptures will result in neonatal death or neurological injury. The benefits of a trial of labor after cesarean (TOLAC) resulting in a vaginal birth after cesarean (VBAC) include the following: shorter length of hospital stay and postpartum recovery (in most cases); fewer complications, such as postpartum fever, wound or uterine infection, thromboembolism (blood clots in the leg or lung), need for blood transfusion and fewer neonatal breathing problems.  The risks of an attempted VBAC or TOLAC include the  following: Risk of failed trial of labor after cesarean (TOLAC) without a vaginal birth after cesarean (VBAC) resulting in repeat cesarean delivery (RCD) in about 20 to 40 percent of women who attempt VBAC.  Risk of rupture of uterus resulting in an emergency cesarean delivery. The risk of uterine rupture may be related in part to the type of uterine incision made during the first cesarean delivery. A previous transverse uterine incision has the lowest risk of rupture (0.2 to 1.5 percent risk). Vertical or T-shaped uterine incisions have a higher risk of uterine rupture (4 to 9 percent risk)The risk of fetal death is very low with both VBAC and elective repeat cesarean delivery (ERCD), but the likelihood of fetal death is higher with VBAC than with ERCD. Maternal death is very rare with either type of delivery.  The risks of an elective repeat cesarean delivery (ERCD) were reviewed with the patient including but not limited to: 01/999 risk of uterine rupture which could have serious consequences, bleeding which may require transfusion; infection which may require antibiotics; injury to bowel, bladder or other surrounding organs (bowel, bladder, ureters); injury to the fetus; need for additional procedures including hysterectomy in the event of a life-threatening hemorrhage; thromboembolic phenomenon; abnormal placentation; incisional problems; death and other postoperative or anesthesia complications.    I believe she would be a good candidate.  Obesity should not be a factor.  Anesthesia consult soon to assess delivery locale.    Arrange  transfer if needed    Insurance does not allow for [employer] [place] here 2 weeks  Annamarie Major, MD, [facility], [facility] 10/17/2018  9:41 AM

## 2018-10-17 NOTE — Patient Instructions (Signed)
Braxton Hicks Contractions °Contractions of the uterus can occur throughout pregnancy, but they are not always a sign that you are in labor. You may have practice contractions called Braxton Hicks contractions. These false labor contractions are sometimes confused with true labor. °What are Braxton Hicks contractions? °Braxton Hicks contractions are tightening movements that occur in the muscles of the uterus before labor. Unlike true labor contractions, these contractions do not result in opening (dilation) and thinning of the cervix. Toward the end of pregnancy (32-34 weeks), Braxton Hicks contractions can happen more often and may become stronger. These contractions are sometimes difficult to tell apart from true labor because they can be very uncomfortable. You should not feel embarrassed if you go to the hospital with false labor. °Sometimes, the only way to tell if you are in true labor is for your health care provider to look for changes in the cervix. The health care provider will do a physical exam and may monitor your contractions. If you are not in true labor, the exam should show that your cervix is not dilating and your water has not broken. °If there are other health problems associated with your pregnancy, it is completely safe for you to be sent home with false labor. You may continue to have Braxton Hicks contractions until you go into true labor. °How to tell the difference between true labor and false labor °True labor °· Contractions last 30-70 seconds. °· Contractions become very regular. °· Discomfort is usually felt in the top of the uterus, and it spreads to the lower abdomen and low back. °· Contractions do not go away with walking. °· Contractions usually become more intense and increase in frequency. °· The cervix dilates and gets thinner. °False labor °· Contractions are usually shorter and not as strong as true labor contractions. °· Contractions are usually irregular. °· Contractions  are often felt in the front of the lower abdomen and in the groin. °· Contractions may go away when you walk around or change positions while lying down. °· Contractions get weaker and are shorter-lasting as time goes on. °· The cervix usually does not dilate or become thin. °Follow these instructions at home: °· Take over-the-counter and prescription medicines only as told by your health care provider. °· Keep up with your usual exercises and follow other instructions from your health care provider. °· Eat and drink lightly if you think you are going into labor. °· If Braxton Hicks contractions are making you uncomfortable: °? Change your position from lying down or resting to walking, or change from walking to resting. °? Sit and rest in a tub of warm water. °? Drink enough fluid to keep your urine pale yellow. Dehydration may cause these contractions. °? Do slow and deep breathing several times an hour. °· Keep all follow-up prenatal visits as told by your health care provider. This is important. °Contact a health care provider if: °· You have a fever. °· You have continuous pain in your abdomen. °Get help right away if: °· Your contractions become stronger, more regular, and closer together. °· You have fluid leaking or gushing from your vagina. °· You pass blood-tinged mucus (bloody show). °· You have bleeding from your vagina. °· You have low back pain that you never had before. °· You feel your baby’s head pushing down and causing pelvic pressure. °· Your baby is not moving inside you as much as it used to. °Summary °· Contractions that occur before labor are called Braxton   Hicks contractions, false labor, or practice contractions. °· Braxton Hicks contractions are usually shorter, weaker, farther apart, and less regular than true labor contractions. True labor contractions usually become progressively stronger and regular and they become more frequent. °· Manage discomfort from Braxton Hicks contractions by  changing position, resting in a warm bath, drinking plenty of water, or practicing deep breathing. °This information is not intended to replace advice given to you by your health care provider. Make sure you discuss any questions you have with your health care provider. °Document Released: 04/13/2017 Document Revised: 04/13/2017 Document Reviewed: 04/13/2017 °Elsevier Interactive Patient Education © 2018 Elsevier Inc. ° °

## 2018-10-17 NOTE — Addendum Note (Signed)
Addended by: Cornelius Moras D on: 10/17/2018 10:12 AM   Modules accepted: Orders

## 2018-10-22 ENCOUNTER — Encounter
Admission: RE | Admit: 2018-10-22 | Discharge: 2018-10-22 | Disposition: A | Discharge status: Home / Self Care | Payer: BLUE CROSS/BLUE SHIELD | Source: Ambulatory Visit | Attending: Anesthesiology | Admitting: Anesthesiology

## 2018-10-22 NOTE — Consult Note (Signed)
[facility] Anesthesia Consultation  DEONDRIA PURYEAR ZOX:[id number] DOB: 03/13/1981 DOA: 10/22/2018 PCP: [facility], [place]   Requesting physician: Dr. Tiburcio Pea Date of consultation: 10/22/18 Reason for consultation: Obesity during pregnancy  CHIEF COMPLAINT:  Obesity during pregnancy  HISTORY OF PRESENT ILLNESS: Cimberly Stoffel  is a 37 y.o. female with a known history of obesity during pregnancy. Had preeclampsia and fetal malpresentation requiring urgent cesarean delivery during prior pregnancy. Denies hx of cardiac disease. Denies hx of asthma. Denies personal or family hx of bleeding disorders.  PAST MEDICAL HISTORY:  No past medical history on file.  PAST SURGICAL HISTORY:  Past Surgical History:  Procedure Laterality Date  . CESAREAN SECTION    . DILATION AND CURETTAGE OF UTERUS    . DILATION AND EVACUATION N/A 09/22/2015   Procedure: DILATATION AND EVACUATION;  Surgeon: Vena Austria, MD;  Location: [facility] ORS;  Service: Gynecology;  Laterality: N/A;  . KNEE ARTHROSCOPY Right     SOCIAL HISTORY:  Social History   Tobacco Use  . Smoking status: Never Smoker  . Smokeless tobacco: Never Used  Substance Use Topics  . Alcohol use: No    FAMILY HISTORY:  Family History  Problem Relation Age of Onset  . Epilepsy Sister     DRUG ALLERGIES: No Known Allergies  REVIEW OF SYSTEMS:   RESPIRATORY: No cough, shortness of breath, wheezing.  CARDIOVASCULAR: No chest pain, orthopnea, edema.  HEMATOLOGY: No anemia, easy bruising or bleeding SKIN: No rash or lesion. NEUROLOGIC: No tingling, numbness, weakness.  PSYCHIATRY: No anxiety or depression.   MEDICATIONS AT HOME:  Prior to Admission medications   Medication Sig Start Date End Date Taking? Authorizing Provider  aspirin EC 81 MG tablet Take 81 mg by mouth daily.    [provider]  folic acid ([facility] FOLIC ACID) 800 MCG tablet  04/06/18   [provider]   Omega-3 1000 MG CAPS  12/26/17   [provider]  Prenatal Vit-Fe Fumarate-FA (PRENATAL VITAMIN PO) Take by mouth.    [provider]      PHYSICAL EXAMINATION:   VITAL SIGNS: Blood pressure (!) 143/79, pulse 88, temperature 36.6 C, temperature source Oral, height 5\' 4"  (1.626 m), weight (!) 143.7 kg, last menstrual period 03/08/2018, SpO2 99 %, unknown if currently breastfeeding.  GENERAL:  37 y.o.-year-old patient no acute distress.  HEENT: Head atraumatic, normocephalic. Oropharynx and nasopharynx clear. MP 2, TM distance >3 cm, normal mouth opening, grade 1 upper lip bite. LUNGS: Normal breath sounds bilaterally, no wheezing, rales,rhonchi. No use of accessory muscles of respiration.  CARDIOVASCULAR: S1, S2 normal. No murmurs, rubs, or gallops.  EXTREMITIES: No pedal edema, cyanosis, or clubbing.  NEUROLOGIC: normal gait PSYCHIATRIC: The patient is alert and oriented x 3.  SKIN: No obvious rash, lesion, or ulcer.    IMPRESSION AND PLAN:   Dollye Glasser  is a 37 y.o. female presenting with obesity during pregnancy. BMI is currently 54 at [redacted] weeks gestation.   Pt is unsure about plan for delivery at this time, repeat cesarean vs TOLAC.   We discussed delivery at [facility] if plan for repeat cesarean delivery. Airway exam is reassuring and plan would be for neuraxial anesthesia. Lower risk of airway complications for scheduled cesarean delivery.   If TOLAC is plan for delivery would recommend transfer of care to center with higher maternal level of care designation. Increased risk for airway complications, mucosal swelling during labor and increased risk of difficult airway in setting of  emergent cesarean delivery.

## 2018-10-31 ENCOUNTER — Other Ambulatory Visit: Payer: BLUE CROSS/BLUE SHIELD

## 2018-10-31 ENCOUNTER — Ambulatory Visit (INDEPENDENT_AMBULATORY_CARE_PROVIDER_SITE_OTHER): Payer: BLUE CROSS/BLUE SHIELD

## 2018-10-31 ENCOUNTER — Encounter: Payer: BLUE CROSS/BLUE SHIELD | Admitting: Maternal Newborn

## 2018-10-31 ENCOUNTER — Encounter: Payer: BLUE CROSS/BLUE SHIELD | Admitting: Obstetrics and Gynecology

## 2018-10-31 ENCOUNTER — Ambulatory Visit (INDEPENDENT_AMBULATORY_CARE_PROVIDER_SITE_OTHER): Payer: BLUE CROSS/BLUE SHIELD | Admitting: Obstetrics and Gynecology

## 2018-10-31 VITALS — BP 118/70 | Wt 315.0 lb

## 2018-10-31 DIAGNOSIS — Z362 Encounter for other antenatal screening follow-up: Secondary | ICD-10-CM | POA: Diagnosis not present

## 2018-10-31 DIAGNOSIS — O99210 Obesity complicating pregnancy, unspecified trimester: Secondary | ICD-10-CM

## 2018-10-31 DIAGNOSIS — O34219 Maternal care for unspecified type scar from previous cesarean delivery: Secondary | ICD-10-CM

## 2018-10-31 DIAGNOSIS — Z98891 History of uterine scar from previous surgery: Secondary | ICD-10-CM

## 2018-10-31 DIAGNOSIS — O0990 Supervision of high risk pregnancy, unspecified, unspecified trimester: Secondary | ICD-10-CM

## 2018-10-31 DIAGNOSIS — O09523 Supervision of elderly multigravida, third trimester: Secondary | ICD-10-CM

## 2018-10-31 DIAGNOSIS — O09299 Supervision of pregnancy with other poor reproductive or obstetric history, unspecified trimester: Secondary | ICD-10-CM

## 2018-10-31 DIAGNOSIS — O3401 Maternal care for unspecified congenital malformation of uterus, first trimester: Secondary | ICD-10-CM

## 2018-10-31 DIAGNOSIS — O3403 Maternal care for unspecified congenital malformation of uterus, third trimester: Secondary | ICD-10-CM

## 2018-10-31 DIAGNOSIS — O09522 Supervision of elderly multigravida, second trimester: Secondary | ICD-10-CM

## 2018-10-31 DIAGNOSIS — O09293 Supervision of pregnancy with other poor reproductive or obstetric history, third trimester: Secondary | ICD-10-CM

## 2018-10-31 DIAGNOSIS — Z3A33 33 weeks gestation of pregnancy: Secondary | ICD-10-CM

## 2018-10-31 DIAGNOSIS — Q513 Bicornate uterus: Secondary | ICD-10-CM

## 2018-10-31 DIAGNOSIS — O99213 Obesity complicating pregnancy, third trimester: Secondary | ICD-10-CM

## 2018-10-31 LAB — POCT URINALYSIS DIPSTICK OB
Glucose, UA: NEGATIVE
POC,PROTEIN,UA: NEGATIVE

## 2018-10-31 NOTE — Progress Notes (Signed)
Routine Prenatal Care Visit  Subjective  Evelyn Wright is a 37 y.o. [id number] at 5461w6d being seen today for ongoing prenatal care.  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  ----------------------------------------------------------------------------------- Patient reports no complaints.   Contractions: Not present. Vag. Bleeding: None.  Movement: Present. Denies leaking of fluid.  ----------------------------------------------------------------------------------- The following portions of the patient's history were reviewed and updated as appropriate: allergies, current medications, past family history, past medical history, past social history, past surgical history and problem list. Problem list updated.   Objective  Blood pressure 118/70, weight (!) 315 lb (142.9 kg), last menstrual period 03/08/2018, unknown if currently breastfeeding. Pregravid weight 270 lb (122.5 kg) Total Weight Gain 45 lb (20.4 kg)  Body mass index is 54.07 kg/m.  Urinalysis:      Fetal Status: Fetal Heart Rate (bpm): 135   Movement: Present     General:  Alert, oriented and cooperative. Patient is in no acute distress.  Skin: Skin is warm and dry. No rash noted.   Cardiovascular: Normal heart rate noted  Respiratory: Normal respiratory effort, no problems with respiration noted    Abdomen: Soft, gravid, appropriate for gestational age. Pain/Pressure: Absent     Pelvic:  Cervical exam deferred        Extremities: Normal range of motion.     ental Status: Normal mood and affect. Normal behavior. Normal judgment and thought content.   [place] Ob Follow Up  Result Date: 11/01/2018 Patient Name: Evelyn AlvineQuinreasa L Stahlecker DOB: [birth year] MRN: [id number] ULTRASOUND REPORT Location: [facility] Date of Service: 10/31/2018 Indications:growth/afi Findings: Mason JimSingleton intrauterine pregnancy is visualized with FHR at 132 BPM. Biometrics give an (U/S) Gestational age of 2862w3d and an (U/S) EDD of 12/16/2018; this correlates with the clinically established Estimated Date of Delivery: 12/13/18. Fetal presentation is transverse. Placenta: posterior. Grade: 1 AFI: 13.2 cm Growth percentile is 32.7. EFW: 2,127g (4lbs 11oz) Impression: 1. 6661w6d Viable Singleton Intrauterine pregnancy previously established criteria. 2. Growth is 32.7 %ile.  AFI is 13.2 cm. Recommendations: 1.Clinical correlation with the patient's History and Physical Exam. Darlina GuysAbby M Clarke, RDMS RVT There is a singleton gestation with normal amniotic fluid volume. The fetal biometry correlates with established dating.  Limited fetal anatomy was performed.The visualized fetal anatomical survey appears within normal limits within the resolution of ultrasound as described above.  It must be noted that a normal ultrasound is unable to rule out fetal aneuploidy.  Vena AustriaAndreas Rayyan Burley, MD, Evern CoreFACOG [facility], [facility]      Assessment   37 y.o. [phone] 5918G6P1041 at 3861w6d by  12/13/2018, by Last Menstrual Period presenting for routine prenatal visit  Plan   pregnancy Problems (from 04/04/18 to present)    Problem Noted Resolved   Supervision of high risk pregnancy, antepartum 04/04/2018 by Conard NovakJackson, Stephen D, MD No   Overview Addendum 08/22/2018 11:44 AM by Oswaldo ConroySchmid, Jacelyn Y, CNM      [facility]inic [facility] Prenatal Labs  Dating  Blood type:  B/Positive/-- (04/24  1053)   Genetic Screen 1 Screen:     MSAFP:    neg  Quad:      NIPS:  Diploid XY  Antibody:Negative (04/24 1053)  Anatomic [place] Normal but incomplete at [facility] Rubella: 1.98 (04/24 1053) Varicella: Nonimmune  GTT Early: 69       28 wk:      RPR: Non Reactive (04/24 1053)   Rhogam positive HBsAg: Negative (04/24 1053)   TDaP vaccine                       HIV: Non Reactive (04/24 1053)   Flu Shot  08/22/2018                              GBS:   Contraception  Pap:  CBB     CS/VBAC    Baby Food    Support Person             Obesity affecting pregnancy 04/04/2018 by Conard Novak, MD No   Overview Addendum 10/17/2018  5:40 PM by Farrel Conners, CNM    BMI >=40 [ ]  early 1h gtt -  [ ]  u/s for dating [ ]   [ ]  nutritional goals [ ]  folic acid 1mg  [ ]  bASA (>12 weeks) [ ]  consider nutrition consult [ ]  consider maternal EKG 1st trimester [ ]  Growth u/s 28 [x ], 32 [ ] , 36 weeks [ ]   Growth ultrasound at 29wk 4 day was in the 43rd% (3#3oz) on Oct 21-transverse presentation [ ]  NST/AFI weekly 36+ weeks (36[] , 37[] , 38[] , 39[] , 40[] ) [ ]  IOL by 41 weeks (scheduled, prn [] )      Morbid obesity with BMI of 50.0-59.9, adult (HCC) 04/04/2018 by Conard Novak, MD No   Advanced maternal age in multigravida 04/04/2018 by Conard Novak, MD No   First trimester bleeding 04/04/2018 by Conard Novak, MD No   Pregnancy complicated by previous recurrent miscarriages 04/04/2018 by Conard Novak, MD No       Gestational age appropriate obstetric precautions including but not limited to vaginal bleeding, contractions, leaking of fluid and fetal movement were reviewed in detail with the patient.    - had anesthesia consult, is ok with proceeding with RLTCS for delivery since not a TOLAC candidate following anesthesia consult and discussion at [facility] meeting.  Her overall success rate utilizing the VBAC calculator is only 17.3 as well.  - start APT next visit at  36 weeks  Return in about 2 weeks (around 11/14/2018) for ROB/GROWTH/NST.  Vena Austria, MD, Evern Core [facility], [facility] 10/31/2018, 9:10 AM

## 2018-10-31 NOTE — Progress Notes (Signed)
ROB/Growth  C/o pelvic pressure, numbness and tingling in hands

## 2018-11-01 ENCOUNTER — Telehealth: Payer: Self-pay | Admitting: Obstetrics and Gynecology

## 2018-11-01 NOTE — Telephone Encounter (Signed)
[facility] @ 8:10am w/ Dr Jean RosenthalJackson, Pre-admit Testing afterwards, and OR on 12/06/18.

## 2018-11-01 NOTE — Telephone Encounter (Signed)
Lmtrc

## 2018-11-01 NOTE — Telephone Encounter (Signed)
-----   Message from Vena AustriaAndreas Staebler, MD sent at 10/31/2018  9:32 AM EST ----- Regarding: [facility] Surgery Date:   LOS: surgery admit  Surgery Booking Request Patient Full Name: Evelyn Wright MRN: [id number]  DOB: 01-Apr-1981  Surgeon: Thomasene MohairStephen Jackson, MD  Requested Surgery Date and Time: 12/06/18 Primary Diagnosis and Code: Repeat cesarean Secondary Diagnosis and Code:  Surgical Procedure: Cesarean Section L&D Notification:yes Admission Status: same day surgery Length of Surgery: 1.5hrs Special Case Needs: none H&P: 2 days prior (date) Phone Interview or Office Pre-Admit: pre-admit Interpreter: No Language: English Medical Clearance: No Special Scheduling Instructions: none

## 2018-11-14 ENCOUNTER — Ambulatory Visit (INDEPENDENT_AMBULATORY_CARE_PROVIDER_SITE_OTHER): Payer: BLUE CROSS/BLUE SHIELD

## 2018-11-14 ENCOUNTER — Encounter: Payer: Self-pay | Admitting: Advanced Practice Midwife

## 2018-11-14 ENCOUNTER — Ambulatory Visit (INDEPENDENT_AMBULATORY_CARE_PROVIDER_SITE_OTHER): Payer: BLUE CROSS/BLUE SHIELD | Admitting: Advanced Practice Midwife

## 2018-11-14 VITALS — BP 110/70 | Wt 318.0 lb

## 2018-11-14 DIAGNOSIS — O34219 Maternal care for unspecified type scar from previous cesarean delivery: Secondary | ICD-10-CM

## 2018-11-14 DIAGNOSIS — O09293 Supervision of pregnancy with other poor reproductive or obstetric history, third trimester: Secondary | ICD-10-CM

## 2018-11-14 DIAGNOSIS — O99213 Obesity complicating pregnancy, third trimester: Secondary | ICD-10-CM

## 2018-11-14 DIAGNOSIS — O09523 Supervision of elderly multigravida, third trimester: Secondary | ICD-10-CM | POA: Diagnosis not present

## 2018-11-14 DIAGNOSIS — O99210 Obesity complicating pregnancy, unspecified trimester: Secondary | ICD-10-CM

## 2018-11-14 DIAGNOSIS — O0990 Supervision of high risk pregnancy, unspecified, unspecified trimester: Secondary | ICD-10-CM

## 2018-11-14 DIAGNOSIS — O09299 Supervision of pregnancy with other poor reproductive or obstetric history, unspecified trimester: Secondary | ICD-10-CM

## 2018-11-14 DIAGNOSIS — Z98891 History of uterine scar from previous surgery: Secondary | ICD-10-CM

## 2018-11-14 DIAGNOSIS — Z3A35 35 weeks gestation of pregnancy: Secondary | ICD-10-CM | POA: Diagnosis not present

## 2018-11-14 DIAGNOSIS — Z3685 Encounter for antenatal screening for Streptococcus B: Secondary | ICD-10-CM

## 2018-11-14 DIAGNOSIS — O09522 Supervision of elderly multigravida, second trimester: Secondary | ICD-10-CM

## 2018-11-14 LAB — POCT URINALYSIS DIPSTICK OB
Glucose, UA: NEGATIVE
PROTEIN: NEGATIVE

## 2018-11-14 NOTE — Progress Notes (Signed)
Routine Prenatal Care Visit  Subjective  Evelyn Wright is a 37 y.o. [id number] at [redacted]w[redacted]d being seen today for ongoing prenatal care.  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  ----------------------------------------------------------------------------------- Patient reports no complaints.   Contractions: Not present. Vag. Bleeding: None.  Movement: Present. Denies leaking of fluid.  ----------------------------------------------------------------------------------- The following portions of the patient's history were reviewed and updated as appropriate: allergies, current medications, past family history, past medical history, past social history, past surgical history and problem list. Problem list updated.   Objective  Blood pressure 110/70, weight (!) 318 lb (144.2 kg), last menstrual period 03/08/2018 Pregravid weight 270 lb (122.5 kg) Total Weight Gain 48 lb (21.8 kg) Urinalysis: Urine Protein Negative  Urine Glucose Negative  Fetal Status: Fetal Heart Rate (bpm): 135   Movement: Present     Growth and AFI today: 63.1%, 6 pounds 10 ounces, AFI 12.0 NST reactive 20 minute tracing, baseline 135, moderate variability, +accelerations, -decelerations  General:  Alert, oriented and cooperative. Patient is in no  acute distress.  Skin: Skin is warm and dry. No rash noted.   Cardiovascular: Normal heart rate noted  Respiratory: Normal respiratory effort, no problems with respiration noted  Abdomen: Soft, gravid, appropriate for gestational age. Pain/Pressure: Present     Pelvic:  GBS collected        Extremities: Normal range of motion.  Edema: None  Mental Status: Normal mood and affect. Normal behavior. Normal judgment and thought content.   Assessment   37 y.o. [id number] at [redacted]w[redacted]d by  12/13/2018, by Last Menstrual Period presenting for routine prenatal visit  Plan   pregnancy Problems (from 04/04/18 to present)    Problem Noted Resolved   Supervision of high risk pregnancy, antepartum 04/04/2018 by Conard Novak, MD No   Overview Addendum 08/22/2018 11:44 AM by Oswaldo Conroy, CNM      Clinic [facility] Prenatal Labs  Dating  Blood type: B/Positive/-- (04/24 1053)   Genetic Screen 1 Screen:     MSAFP:    neg  Quad:      NIPS:  Diploid XY  Antibody:Negative (04/24 1053)  Anatomic [place] Normal but incomplete at [facility] Rubella: 1.98 (04/24 1053) Varicella: Nonimmune  GTT Early: 69       28 wk:      RPR: Non Reactive (04/24 1053)   Rhogam positive HBsAg: Negative (04/24 1053)   TDaP vaccine                       HIV: Non Reactive (04/24 1053)   Flu Shot  08/22/2018                              GBS:   Contraception  Pap:  CBB     CS/VBAC    Baby Food    Support Person  Obesity affecting pregnancy 04/04/2018 by Conard NovakJackson, Stephen D, MD No   Overview Addendum 10/17/2018  5:40 PM by Farrel ConnersGutierrez, Colleen, CNM    BMI >=40 [ ]  early 1h gtt -  [ ]  u/s for dating [ ]   [ ]  nutritional goals [ ]  folic acid 1mg  [ ]  bASA (>12 weeks) [ ]  consider nutrition consult [ ]  consider maternal EKG 1st trimester [ ]  Growth u/s 28 [x ], 32 [ ] , 36 weeks [ ]   Growth ultrasound at 29wk 4 day was in the 43rd% (3#3oz) on Oct 21-transverse presentation [ ]  NST/AFI weekly 36+ weeks (36[] , 37[] , 38[] ,  39[] , 40[] ) [ ]  IOL by 41 weeks (scheduled, prn [] )      Morbid obesity with BMI of 50.0-59.9, adult (HCC) 04/04/2018 by Conard NovakJackson, Stephen D, MD No   Advanced maternal age in multigravida 04/04/2018 by Conard NovakJackson, Stephen D, MD No   First trimester bleeding 04/04/2018 by Conard NovakJackson, Stephen D, MD No   Pregnancy complicated by previous recurrent miscarriages 04/04/2018 by Conard NovakJackson, Stephen D, MD No       Preterm labor symptoms and general obstetric precautions including but not limited to vaginal bleeding, contractions, leaking of fluid and fetal movement were reviewed in detail with the patient.   Return in about 1 week (around 11/21/2018) for rob.  Tresea MallJane Laityn Bensen, CNM 11/14/2018 3:27 PM

## 2018-11-17 LAB — STREP GP B NAA: Strep Gp B NAA: NEGATIVE

## 2018-11-22 ENCOUNTER — Ambulatory Visit (INDEPENDENT_AMBULATORY_CARE_PROVIDER_SITE_OTHER): Payer: BLUE CROSS/BLUE SHIELD

## 2018-11-22 DIAGNOSIS — O0990 Supervision of high risk pregnancy, unspecified, unspecified trimester: Secondary | ICD-10-CM

## 2018-11-22 DIAGNOSIS — Z3689 Encounter for other specified antenatal screening: Secondary | ICD-10-CM | POA: Diagnosis not present

## 2018-11-23 ENCOUNTER — Ambulatory Visit (INDEPENDENT_AMBULATORY_CARE_PROVIDER_SITE_OTHER): Payer: BLUE CROSS/BLUE SHIELD | Admitting: Advanced Practice Midwife

## 2018-11-23 ENCOUNTER — Encounter: Payer: Self-pay | Admitting: Advanced Practice Midwife

## 2018-11-23 VITALS — BP 120/80 | Wt 320.0 lb

## 2018-11-23 DIAGNOSIS — O09523 Supervision of elderly multigravida, third trimester: Secondary | ICD-10-CM | POA: Diagnosis not present

## 2018-11-23 DIAGNOSIS — Z3A37 37 weeks gestation of pregnancy: Secondary | ICD-10-CM

## 2018-11-23 DIAGNOSIS — O09293 Supervision of pregnancy with other poor reproductive or obstetric history, third trimester: Secondary | ICD-10-CM

## 2018-11-23 DIAGNOSIS — O0990 Supervision of high risk pregnancy, unspecified, unspecified trimester: Secondary | ICD-10-CM

## 2018-11-23 NOTE — Progress Notes (Signed)
Routine Prenatal Care Visit  Subjective  Evelyn Wright is a 37 y.o. [id number] at 4314w1d being seen today for ongoing prenatal care.  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  ----------------------------------------------------------------------------------- Patient reports no complaints.   Contractions: Not present. Vag. Bleeding: None.  Movement: Present. Denies leaking of fluid.  ----------------------------------------------------------------------------------- The following portions of the patient's history were reviewed and updated as appropriate: allergies, current medications, past family history, past medical history, past social history, past surgical history and problem list. Problem list updated.   Objective  Blood pressure 120/80, weight (!) 320 lb (145.2 kg), last menstrual period 03/08/2018 Pregravid weight 270 lb (122.5 kg) Total Weight Gain 50 lb (22.7 kg) Urinalysis: Urine Protein    Urine Glucose    Fetal Status: Fetal Heart Rate (bpm): 125   Movement: Present     AFI on 11/22/2018: 14.7 NST today: Reactive 20 minute tracing, 125 bpm, moderate variability, +accelerations, -decelerations  General:  Alert, oriented and cooperative. Patient is in no acute distress.  Skin: Skin is warm and dry. No  rash noted.   Cardiovascular: Normal heart rate noted  Respiratory: Normal respiratory effort, no problems with respiration noted  Abdomen: Soft, gravid, appropriate for gestational age. Pain/Pressure: Present     Pelvic:  Cervical exam deferred        Extremities: Normal range of motion.  Edema: None  Mental Status: Normal mood and affect. Normal behavior. Normal judgment and thought content.   Assessment   37 y.o. [name] at 3814w1d by  12/13/2018, by Last Menstrual Period presenting for routine prenatal visit  Plan   pregnancy Problems (from 04/04/18 to present)    Problem Noted Resolved   Supervision of high risk pregnancy, antepartum 04/04/2018 by Conard NovakJackson, Stephen D, MD No   Overview Addendum 08/22/2018 11:44 AM by Oswaldo ConroySchmid, Jacelyn Y, CNM      Clinic [facility] Prenatal Labs  Dating  Blood type: B/Positive/-- (04/24 1053)   Genetic Screen 1 Screen:     MSAFP:    neg  Quad:      NIPS:  Diploid XY  Antibody:Negative (04/24 1053)  [facility] Rub[facility]: 1.98 (04/24 1053) Varicella: Nonimmune  GTT Early: 69       28 wk:      RPR: Non Reactive (04/24 1053)   Rhogam positive HBsAg: Negative (04/24 1053)   TDaP vaccine                       HIV: Non Reactive (04/24 1053)   Flu Shot  08/22/2018                              GBS:   Contraception  Pap:  CBB     CS/VBAC    Baby Food    Support Person  Obesity affecting pregnancy 04/04/2018 by Conard Novak, MD No   Overview Addendum 10/17/2018  5:40 PM by Farrel Conners, CNM    BMI >=40 [ ]  early 1h gtt -  [ ]  u/s for dating [ ]   [ ]  nutritional goals [ ]  folic acid 1mg  [ ]  bASA (>12 weeks) [ ]  consider nutrition consult [ ]  consider maternal EKG 1st trimester [ ]  Growth u/s 28 [x ], 32 [ ] , 36 weeks [ ]   Growth ultrasound at 29wk 4 day was in the 43rd% (3#3oz) on Oct 21-transverse presentation [ ]  NST/AFI weekly 36+ weeks (36[] , 37[] , 38[] , 39[] , 40[] ) [ ]  IOL by 41 weeks (scheduled,  prn [] )      Morbid obesity with BMI of 50.0-59.9, adult (HCC) 04/04/2018 by Conard Novak, MD No   Advanced maternal age in multigravida 04/04/2018 by Conard Novak, MD No   First trimester bleeding 04/04/2018 by Conard Novak, MD No   Pregnancy complicated by previous recurrent miscarriages 04/04/2018 by Conard Novak, MD No       Term labor symptoms and general obstetric precautions including but not limited to vaginal bleeding, contractions, leaking of fluid and fetal movement were reviewed in detail with the patient.   Return in about 1 week (around 11/30/2018) for afi, nst, rob.  Tresea Mall, CNM 11/23/2018 4:05 PM

## 2018-11-28 ENCOUNTER — Ambulatory Visit (INDEPENDENT_AMBULATORY_CARE_PROVIDER_SITE_OTHER): Payer: BLUE CROSS/BLUE SHIELD | Admitting: Obstetrics & Gynecology

## 2018-11-28 ENCOUNTER — Ambulatory Visit (INDEPENDENT_AMBULATORY_CARE_PROVIDER_SITE_OTHER): Payer: BLUE CROSS/BLUE SHIELD

## 2018-11-28 VITALS — BP 120/80 | Wt 322.0 lb

## 2018-11-28 DIAGNOSIS — Z6841 Body Mass Index (BMI) 40.0 and over, adult: Secondary | ICD-10-CM

## 2018-11-28 DIAGNOSIS — O99213 Obesity complicating pregnancy, third trimester: Secondary | ICD-10-CM

## 2018-11-28 DIAGNOSIS — O34219 Maternal care for unspecified type scar from previous cesarean delivery: Secondary | ICD-10-CM | POA: Diagnosis not present

## 2018-11-28 DIAGNOSIS — Z3689 Encounter for other specified antenatal screening: Secondary | ICD-10-CM | POA: Diagnosis not present

## 2018-11-28 DIAGNOSIS — O09523 Supervision of elderly multigravida, third trimester: Secondary | ICD-10-CM | POA: Diagnosis not present

## 2018-11-28 DIAGNOSIS — Z3A37 37 weeks gestation of pregnancy: Secondary | ICD-10-CM

## 2018-11-28 DIAGNOSIS — O09522 Supervision of elderly multigravida, second trimester: Secondary | ICD-10-CM

## 2018-11-28 DIAGNOSIS — O0990 Supervision of high risk pregnancy, unspecified, unspecified trimester: Secondary | ICD-10-CM

## 2018-11-28 DIAGNOSIS — O09293 Supervision of pregnancy with other poor reproductive or obstetric history, third trimester: Secondary | ICD-10-CM | POA: Diagnosis not present

## 2018-11-28 DIAGNOSIS — O09299 Supervision of pregnancy with other poor reproductive or obstetric history, unspecified trimester: Secondary | ICD-10-CM

## 2018-11-28 DIAGNOSIS — Z98891 History of uterine scar from previous surgery: Secondary | ICD-10-CM

## 2018-11-28 DIAGNOSIS — O99210 Obesity complicating pregnancy, unspecified trimester: Secondary | ICD-10-CM

## 2018-11-28 LAB — POCT URINALYSIS DIPSTICK OB
Glucose, UA: NEGATIVE
POC,PROTEIN,UA: NEGATIVE

## 2018-11-28 NOTE — Progress Notes (Signed)
  Subjective  Fetal Movement? yes Contractions? no Leaking Fluid? no Vaginal Bleeding? no  Objective  BP 120/80   Wt (!) 322 lb (146.1 kg)   LMP 03/08/2018 (Exact Date)   BMI 55.27 kg/m  General: NAD Pumonary: no increased work of breathing Abdomen: gravid, non-tender Extremities: no edema Psychiatric: mood appropriate, affect full  Assessment  37 y.o. [id number] at 5751w6d by  12/13/2018, by Last Menstrual Period presenting for routine prenatal visit  Plan   Problem List Items Addressed This Visit      Other   History of cesarean section   Supervision of high risk pregnancy, antepartum   Obesity affecting pregnancy   Advanced maternal age in multigravida   H/O pre-eclampsia in prior pregnancy, currently pregnant    Other Visit Diagnoses    [redacted] weeks gestation of pregnancy    -  Primary   Relevant Orders   POC Urinalysis Dipstick OB (Completed)   BMI 45.0-49.9, adult (HCC)        A NST procedure was performed with FHR monitoring and a normal baseline established, appropriate time of 20-40 minutes of evaluation, and accels >2 seen w 15x15 characteristics.  Results show a REACTIVE NST.   Review of ULTRASOUND.    [facility] personally reviewed images and [facility]    Plan of management to be discussed with patient.  CS planned next week, preop already scheduled    Pros and cons of CS discussed    Does not desire BTL    Plans to breast feed  Annamarie MajorPaul Norely Schlick, MD, Merlinda FrederickFACOG [facility], [facility] 11/28/2018  10:02 AM

## 2018-12-03 ENCOUNTER — Ambulatory Visit (INDEPENDENT_AMBULATORY_CARE_PROVIDER_SITE_OTHER): Payer: BLUE CROSS/BLUE SHIELD | Admitting: Obstetrics and Gynecology

## 2018-12-03 ENCOUNTER — Encounter
Admission: RE | Admit: 2018-12-03 | Discharge: 2018-12-03 | Disposition: A | Discharge status: Home / Self Care | Payer: BLUE CROSS/BLUE SHIELD | Source: Ambulatory Visit | Attending: Obstetrics and Gynecology | Admitting: Obstetrics and Gynecology

## 2018-12-03 ENCOUNTER — Encounter: Payer: Self-pay | Admitting: Obstetrics and Gynecology

## 2018-12-03 ENCOUNTER — Other Ambulatory Visit: Payer: Self-pay

## 2018-12-03 VITALS — BP 118/70 | HR 91 | Ht 64.0 in | Wt 326.0 lb

## 2018-12-03 DIAGNOSIS — Z98891 History of uterine scar from previous surgery: Secondary | ICD-10-CM

## 2018-12-03 DIAGNOSIS — O0990 Supervision of high risk pregnancy, unspecified, unspecified trimester: Secondary | ICD-10-CM

## 2018-12-03 DIAGNOSIS — Z01812 Encounter for preprocedural laboratory examination: Secondary | ICD-10-CM | POA: Insufficient documentation

## 2018-12-03 DIAGNOSIS — O09293 Supervision of pregnancy with other poor reproductive or obstetric history, third trimester: Secondary | ICD-10-CM

## 2018-12-03 DIAGNOSIS — Z3A38 38 weeks gestation of pregnancy: Secondary | ICD-10-CM

## 2018-12-03 DIAGNOSIS — O34219 Maternal care for unspecified type scar from previous cesarean delivery: Secondary | ICD-10-CM

## 2018-12-03 DIAGNOSIS — O09523 Supervision of elderly multigravida, third trimester: Secondary | ICD-10-CM

## 2018-12-03 DIAGNOSIS — O09299 Supervision of pregnancy with other poor reproductive or obstetric history, unspecified trimester: Secondary | ICD-10-CM

## 2018-12-03 DIAGNOSIS — O3403 Maternal care for unspecified congenital malformation of uterus, third trimester: Secondary | ICD-10-CM

## 2018-12-03 DIAGNOSIS — Z6841 Body Mass Index (BMI) 40.0 and over, adult: Secondary | ICD-10-CM

## 2018-12-03 DIAGNOSIS — O99213 Obesity complicating pregnancy, third trimester: Secondary | ICD-10-CM

## 2018-12-03 DIAGNOSIS — O3401 Maternal care for unspecified congenital malformation of uterus, first trimester: Secondary | ICD-10-CM

## 2018-12-03 DIAGNOSIS — Q513 Bicornate uterus: Secondary | ICD-10-CM

## 2018-12-03 DIAGNOSIS — O2623 Pregnancy care for patient with recurrent pregnancy loss, third trimester: Secondary | ICD-10-CM

## 2018-12-03 DIAGNOSIS — O283 Abnormal ultrasonic finding on antenatal screening of mother: Secondary | ICD-10-CM

## 2018-12-03 HISTORY — DX: Anxiety disorder, unspecified: F41.9

## 2018-12-03 LAB — CBC
HCT: 34.7 % — ABNORMAL LOW (ref 36.0–46.0)
Hemoglobin: 11.3 g/dL — ABNORMAL LOW (ref 12.0–15.0)
MCH: 28.5 pg (ref 26.0–34.0)
MCHC: 32.6 g/dL (ref 30.0–36.0)
MCV: 87.6 fL (ref 80.0–100.0)
Platelets: 214 10*3/uL (ref 150–400)
RBC: 3.96 MIL/uL (ref 3.87–5.11)
RDW: 13.2 % (ref 11.5–15.5)
WBC: 10.4 10*3/uL (ref 4.0–10.5)
nRBC: 0 % (ref 0.0–0.2)

## 2018-12-03 LAB — RAPID HIV SCREEN (HIV 1/2 AB+AG)
HIV 1/2 Antibodies: NONREACTIVE
HIV-1 P24 Antigen - HIV24: NONREACTIVE

## 2018-12-03 LAB — TYPE AND SCREEN
ABO/RH(D): B POS
Antibody Screen: NEGATIVE
Extend sample reason: UNDETERMINED

## 2018-12-03 NOTE — H&P (View-Only) (Signed)
OB History & Physical   History of Present Illness:  Chief Complaint: Here for pre-operative visit  HPI:  Evelyn AlvineQuinreasa L Capek is a 37 y.o. [id number] female at 6326w4d dated by LMP consistent with a 7 week ultrasound .  Her pregnancy has been complicated by morbid obesity with BMI >50 (55 today), bicornuate uterus and history of cesarean delivery.    She denies contractions.   She denies leakage of fluid.   She denies vaginal bleeding.   She reports fetal movement.   Last growth u/s 11/14/2018:  64rd %ile, 2,994 grams.  Total weight gain 52 pounds  Maternal Medical History:   Past Medical History:  Diagnosis Date  . No known health problems     Past Surgical History:  Procedure Laterality Date  . CESAREAN SECTION    . DILATION AND CURETTAGE OF UTERUS    . DILATION AND EVACUATION N/A 09/22/2015   Procedure: DILATATION AND EVACUATION;  Surgeon: Vena AustriaAndreas Staebler, MD;  Location: [facility] ORS;  Service: Gynecology;  Laterality: N/A;  . KNEE ARTHROSCOPY Right     No Known Allergies  Prior to Admission medications   Medication Sig Start Date End Date Taking? Authorizing Provider  aspirin EC 81 MG tablet Take 81 mg by mouth daily.   Yes [provider]  folic acid ([employer] FOLIC ACID) 800 MCG tablet Take 800 mcg by mouth daily.    Yes [provider]  Omega-3 Fatty Acids (OMEGA-3 PO) Take 1 capsule by mouth daily.  12/26/17  Yes [provider]  Prenatal Vit-Fe Fumarate-FA (PRENATAL VITAMIN PO) Take 1 tablet by mouth daily.    Yes [provider]    OB History  Gravida Para Term Preterm AB Living  6 1 1   4 1   SAB TAB Ectopic Multiple Live Births  2 2     1     # Outcome Date GA Lbr Len/2nd Weight Sex Delivery Anes PTL Lv  6 Current           5 SAB 09/22/15 3944w0d            Birth Comments: missed AB-had suction D&C  4 TAB 2008          3 SAB 2005          2 TAB 2003          1 Term 05/20/01   6 lb 6 oz (2.892 kg) M CS-LTranv   LIV     Complications:  Preeclampsia    Prenatal care site: [facility]  Social History: She  reports that she has never smoked. She has never used smokeless tobacco. She reports that she does not drink alcohol or use drugs.  Family History: family history includes Epilepsy in her sister.   Review of Systems: Negative x 10 systems reviewed except as noted in the HPI.    Physical Exam:  Vital Signs: BP 118/70 (BP Location: Left Arm, Patient Position: Sitting, Cuff Size: Large)   Pulse 91   Ht 5\' 4"  (1.626 m)   Wt (!) 326 lb (147.9 kg)   LMP 03/08/2018 (Exact Date)   BMI 55.96 kg/m  Constitutional: Well nourished, well developed female in no acute distress.  HEENT: normal Skin: Warm and dry.  Cardiovascular: Regular rate and rhythm.   Extremity: no edema  Respiratory: Clear to auscultation bilateral. Normal respiratory effort Abdomen: FHT present and gravid/NT Back: no CVAT Neuro: DTRs 2+, Cranial nerves grossly intact Psych: Alert and Oriented x3. No memory deficits.  Normal mood and affect.  MS: normal gait, normal bilateral lower extremity ROM/strength/stability.  Pertinent Results:  Prenatal Labs: Blood type/Rh B positive  Antibody screen negative  Rubella Immune  Varicella Not immune    RPR NR  HBsAg negative  HIV negative  GC negative  Chlamydia negative  Genetic screening Diploid XY, msAFP negative  1 hour GTT Early = 69, 28 week = 51  3 hour GTT n/a  GBS negative on 11/15/2018   Baseline FHR: 135 beats/min      Assessment:  Evelyn Wright is a 37 y.o. [id number] female at 7751w4d here for pre-operative visit for repeat cesarean delivery.   Plan:  1. Admit to Labor & Delivery  2. CBC, T&S, NPO, IVF 3. GBS negative.   4. Fetwal well-being: reassuring overall 5. To OR on 12/06/18 for repeat cesarean delivery.  Precautions given for contractions, bleeding, leakage of fluid, or any other concerning symptoms for her to go to L&D for evaluation. Otherwise to the OR on 12/26. Consents  signed today and all questions answered.    Thomasene MohairStephen Jackson, MD 12/03/2018 8:21 AM

## 2018-12-03 NOTE — Progress Notes (Signed)
OB History & Physical   History of Present Illness:  Chief Complaint: Here for pre-operative visit  HPI:  Evelyn Wright is a 37 y.o. [id number] female at 6326w4d dated by LMP consistent with a 7 week ultrasound .  Her pregnancy has been complicated by morbid obesity with BMI >50 (55 today), bicornuate uterus and history of cesarean delivery.    She denies contractions.   She denies leakage of fluid.   She denies vaginal bleeding.   She reports fetal movement.   Last growth u/s 11/14/2018:  64rd %ile, 2,994 grams.  Total weight gain 52 pounds  Maternal Medical History:   Past Medical History:  Diagnosis Date  . No known health problems     Past Surgical History:  Procedure Laterality Date  . CESAREAN SECTION    . DILATION AND CURETTAGE OF UTERUS    . DILATION AND EVACUATION N/A 09/22/2015   Procedure: DILATATION AND EVACUATION;  Surgeon: Vena AustriaAndreas Staebler, MD;  Location: [facility] ORS;  Service: Gynecology;  Laterality: N/A;  . KNEE ARTHROSCOPY Right     No Known Allergies  Prior to Admission medications   Medication Sig Start Date End Date Taking? Authorizing Provider  aspirin EC 81 MG tablet Take 81 mg by mouth daily.   Yes [provider]  folic acid ([employer] FOLIC ACID) 800 MCG tablet Take 800 mcg by mouth daily.    Yes [provider]  Omega-3 Fatty Acids (OMEGA-3 PO) Take 1 capsule by mouth daily.  12/26/17  Yes [provider]  Prenatal Vit-Fe Fumarate-FA (PRENATAL VITAMIN PO) Take 1 tablet by mouth daily.    Yes [provider]    OB History  Gravida Para Term Preterm AB Living  6 1 1   4 1   SAB TAB Ectopic Multiple Live Births  2 2     1     # Outcome Date GA Lbr Len/2nd Weight Sex Delivery Anes PTL Lv  6 Current           5 SAB 09/22/15 3944w0d            Birth Comments: missed AB-had suction D&C  4 TAB 2008          3 SAB 2005          2 TAB 2003          1 Term 05/20/01   6 lb 6 oz (2.892 kg) M CS-LTranv   LIV     Complications:  Preeclampsia    Prenatal care site: [facility]  Social History: She  reports that she has never smoked. She has never used smokeless tobacco. She reports that she does not drink alcohol or use drugs.  Family History: family history includes Epilepsy in her sister.   Review of Systems: Negative x 10 systems reviewed except as noted in the HPI.    Physical Exam:  Vital Signs: BP 118/70 (BP Location: Left Arm, Patient Position: Sitting, Cuff Size: Large)   Pulse 91   Ht 5\' 4"  (1.626 m)   Wt (!) 326 lb (147.9 kg)   LMP 03/08/2018 (Exact Date)   BMI 55.96 kg/m  Constitutional: Well nourished, well developed female in no acute distress.  HEENT: normal Skin: Warm and dry.  Cardiovascular: Regular rate and rhythm.   Extremity: no edema  Respiratory: Clear to auscultation bilateral. Normal respiratory effort Abdomen: FHT present and gravid/NT Back: no CVAT Neuro: DTRs 2+, Cranial nerves grossly intact Psych: Alert and Oriented x3. No memory deficits.  Normal mood and affect.  MS: normal gait, normal bilateral lower extremity ROM/strength/stability.  Pertinent Results:  Prenatal Labs: Blood type/Rh B positive  Antibody screen negative  Rubella Immune  Varicella Not immune    RPR NR  HBsAg negative  HIV negative  GC negative  Chlamydia negative  Genetic screening Diploid XY, msAFP negative  1 hour GTT Early = 69, 28 week = 51  3 hour GTT n/a  GBS negative on 11/15/2018   Baseline FHR: 135 beats/min      Assessment:  Evelyn Wright is a 37 y.o. G6P1041 female at [redacted]w[redacted]d here for pre-operative visit for repeat cesarean delivery.   Plan:  1. Admit to Labor & Delivery  2. CBC, T&S, NPO, IVF 3. GBS negative.   4. Fetwal well-being: reassuring overall 5. To OR on 12/06/18 for repeat cesarean delivery.  Precautions given for contractions, bleeding, leakage of fluid, or any other concerning symptoms for her to go to L&D for evaluation. Otherwise to the OR on 12/26. Consents  signed today and all questions answered.    Jeremyah Jelley, MD 12/03/2018 8:21 AM    

## 2018-12-04 LAB — RPR: RPR Ser Ql: NONREACTIVE

## 2018-12-05 ENCOUNTER — Encounter: Payer: Self-pay | Admitting: Anesthesiology

## 2018-12-05 MED ORDER — DEXTROSE 5 % IV SOLN
3.0000 g | INTRAVENOUS | Status: AC
  Administered 2018-12-06: 3 g via INTRAVENOUS
  Filled 2018-12-05: qty 3

## 2018-12-06 ENCOUNTER — Inpatient Hospital Stay: Payer: BLUE CROSS/BLUE SHIELD | Admitting: Anesthesiology

## 2018-12-06 ENCOUNTER — Inpatient Hospital Stay
Admission: RE | Admit: 2018-12-06 | Discharge: 2018-12-08 | DRG: 787 | Disposition: A | Discharge status: Home / Self Care | Payer: BLUE CROSS/BLUE SHIELD | Attending: Obstetrics and Gynecology | Admitting: Obstetrics and Gynecology

## 2018-12-06 ENCOUNTER — Encounter
Admission: RE | Disposition: A | Discharge status: Home / Self Care | Payer: Self-pay | Source: Home / Self Care | Attending: Obstetrics and Gynecology

## 2018-12-06 ENCOUNTER — Other Ambulatory Visit: Payer: Self-pay

## 2018-12-06 DIAGNOSIS — O09529 Supervision of elderly multigravida, unspecified trimester: Secondary | ICD-10-CM

## 2018-12-06 DIAGNOSIS — Z98891 History of uterine scar from previous surgery: Secondary | ICD-10-CM

## 2018-12-06 DIAGNOSIS — Z3A39 39 weeks gestation of pregnancy: Secondary | ICD-10-CM | POA: Diagnosis not present

## 2018-12-06 DIAGNOSIS — O3403 Maternal care for unspecified congenital malformation of uterus, third trimester: Secondary | ICD-10-CM | POA: Diagnosis present

## 2018-12-06 DIAGNOSIS — O321XX0 Maternal care for breech presentation, not applicable or unspecified: Secondary | ICD-10-CM | POA: Diagnosis present

## 2018-12-06 DIAGNOSIS — O99213 Obesity complicating pregnancy, third trimester: Secondary | ICD-10-CM

## 2018-12-06 DIAGNOSIS — O99214 Obesity complicating childbirth: Secondary | ICD-10-CM | POA: Diagnosis present

## 2018-12-06 DIAGNOSIS — O329XX0 Maternal care for malpresentation of fetus, unspecified, not applicable or unspecified: Secondary | ICD-10-CM | POA: Diagnosis present

## 2018-12-06 DIAGNOSIS — O0990 Supervision of high risk pregnancy, unspecified, unspecified trimester: Secondary | ICD-10-CM

## 2018-12-06 DIAGNOSIS — D62 Acute posthemorrhagic anemia: Secondary | ICD-10-CM | POA: Diagnosis not present

## 2018-12-06 DIAGNOSIS — Z3A38 38 weeks gestation of pregnancy: Secondary | ICD-10-CM

## 2018-12-06 DIAGNOSIS — O99210 Obesity complicating pregnancy, unspecified trimester: Secondary | ICD-10-CM | POA: Diagnosis present

## 2018-12-06 DIAGNOSIS — O9081 Anemia of the puerperium: Secondary | ICD-10-CM | POA: Diagnosis not present

## 2018-12-06 DIAGNOSIS — Q513 Bicornate uterus: Secondary | ICD-10-CM | POA: Diagnosis not present

## 2018-12-06 DIAGNOSIS — O09299 Supervision of pregnancy with other poor reproductive or obstetric history, unspecified trimester: Secondary | ICD-10-CM

## 2018-12-06 DIAGNOSIS — O34211 Maternal care for low transverse scar from previous cesarean delivery: Principal | ICD-10-CM | POA: Diagnosis present

## 2018-12-06 DIAGNOSIS — O09523 Supervision of elderly multigravida, third trimester: Secondary | ICD-10-CM

## 2018-12-06 DIAGNOSIS — Z6841 Body Mass Index (BMI) 40.0 and over, adult: Secondary | ICD-10-CM

## 2018-12-06 LAB — CBC
HCT: 35 % — ABNORMAL LOW (ref 36.0–46.0)
Hemoglobin: 11.3 g/dL — ABNORMAL LOW (ref 12.0–15.0)
MCH: 28.5 pg (ref 26.0–34.0)
MCHC: 32.3 g/dL (ref 30.0–36.0)
MCV: 88.4 fL (ref 80.0–100.0)
NRBC: 0 % (ref 0.0–0.2)
Platelets: 222 10*3/uL (ref 150–400)
RBC: 3.96 MIL/uL (ref 3.87–5.11)
RDW: 13.4 % (ref 11.5–15.5)
WBC: 11.6 10*3/uL — ABNORMAL HIGH (ref 4.0–10.5)

## 2018-12-06 LAB — CREATININE, SERUM
Creatinine, Ser: 0.62 mg/dL (ref 0.44–1.00)
GFR calc Af Amer: >60 mL/min (ref >60)
GFR calc non Af Amer: >60 mL/min (ref >60)

## 2018-12-06 LAB — ABO/RH: ABO/RH(D): B POS

## 2018-12-06 SURGERY — Surgical Case
Anesthesia: Spinal

## 2018-12-06 MED ORDER — METHYLERGONOVINE MALEATE 0.2 MG/ML IJ SOLN
INTRAMUSCULAR | Status: AC
  Filled 2018-12-06: qty 1

## 2018-12-06 MED ORDER — ROCURONIUM BROMIDE 50 MG/5ML IV SOLN
INTRAVENOUS | Status: AC
  Filled 2018-12-06: qty 1

## 2018-12-06 MED ORDER — SENNOSIDES-DOCUSATE SODIUM 8.6-50 MG PO TABS
2.0000 | ORAL_TABLET | ORAL | Status: DC
  Administered 2018-12-06 – 2018-12-07 (×2): 2 via ORAL
  Filled 2018-12-06 (×2): qty 2

## 2018-12-06 MED ORDER — IBUPROFEN 600 MG PO TABS
600.0000 mg | ORAL_TABLET | Freq: Four times a day (QID) | ORAL | Status: DC
  Administered 2018-12-07 – 2018-12-08 (×5): 600 mg via ORAL
  Filled 2018-12-06 (×5): qty 1

## 2018-12-06 MED ORDER — ONDANSETRON HCL 4 MG/2ML IJ SOLN
INTRAMUSCULAR | Status: DC | PRN
  Administered 2018-12-06: 4 mg via INTRAVENOUS

## 2018-12-06 MED ORDER — MORPHINE SULFATE (PF) 0.5 MG/ML IJ SOLN
INTRAMUSCULAR | Status: AC
  Filled 2018-12-06: qty 10

## 2018-12-06 MED ORDER — SODIUM CHLORIDE 0.9 % IV SOLN
INTRAVENOUS | Status: DC | PRN
  Administered 2018-12-06: 30 ug/min via INTRAVENOUS

## 2018-12-06 MED ORDER — LACTATED RINGERS IV SOLN: INTRAVENOUS | Status: DC

## 2018-12-06 MED ORDER — ENOXAPARIN SODIUM 40 MG/0.4ML ~~LOC~~ SOLN
40.0000 mg | SUBCUTANEOUS | Status: DC
  Administered 2018-12-07 – 2018-12-08 (×2): 40 mg via SUBCUTANEOUS
  Filled 2018-12-06 (×2): qty 0.4

## 2018-12-06 MED ORDER — OXYCODONE-ACETAMINOPHEN 5-325 MG PO TABS: 1.0000 | ORAL_TABLET | ORAL | Status: DC | PRN

## 2018-12-06 MED ORDER — CARBOPROST TROMETHAMINE 250 MCG/ML IM SOLN
INTRAMUSCULAR | Status: AC
  Filled 2018-12-06: qty 1

## 2018-12-06 MED ORDER — ONDANSETRON HCL 4 MG/2ML IJ SOLN: 4.0000 mg | Freq: Three times a day (TID) | INTRAMUSCULAR | Status: DC | PRN

## 2018-12-06 MED ORDER — MENTHOL 3 MG MT LOZG
1.0000 | LOZENGE | OROMUCOSAL | Status: DC | PRN
  Filled 2018-12-06: qty 9

## 2018-12-06 MED ORDER — BUPIVACAINE HCL (PF) 0.5 % IJ SOLN: 5.0000 mL | Freq: Once | INTRAMUSCULAR | Status: DC

## 2018-12-06 MED ORDER — FERROUS SULFATE 325 (65 FE) MG PO TABS
325.0000 mg | ORAL_TABLET | Freq: Two times a day (BID) | ORAL | Status: DC
  Administered 2018-12-06 – 2018-12-08 (×4): 325 mg via ORAL
  Filled 2018-12-06 (×4): qty 1

## 2018-12-06 MED ORDER — MIDAZOLAM HCL 2 MG/2ML IJ SOLN
INTRAMUSCULAR | Status: AC
  Filled 2018-12-06: qty 2

## 2018-12-06 MED ORDER — SIMETHICONE 80 MG PO CHEW
80.0000 mg | CHEWABLE_TABLET | Freq: Three times a day (TID) | ORAL | Status: DC
  Administered 2018-12-06 – 2018-12-08 (×6): 80 mg via ORAL
  Filled 2018-12-06 (×6): qty 1

## 2018-12-06 MED ORDER — OXYCODONE-ACETAMINOPHEN 5-325 MG PO TABS: 2.0000 | ORAL_TABLET | ORAL | Status: DC | PRN

## 2018-12-06 MED ORDER — OXYTOCIN 40 UNITS IN LACTATED RINGERS INFUSION - SIMPLE MED
INTRAVENOUS | Status: DC | PRN
  Administered 2018-12-06: 500 mL via INTRAVENOUS

## 2018-12-06 MED ORDER — SODIUM CHLORIDE 0.9% FLUSH: 3.0000 mL | INTRAVENOUS | Status: DC | PRN

## 2018-12-06 MED ORDER — PROPOFOL 10 MG/ML IV BOLUS
INTRAVENOUS | Status: AC
  Filled 2018-12-06: qty 20

## 2018-12-06 MED ORDER — SOD CITRATE-CITRIC ACID 500-334 MG/5ML PO SOLN
30.0000 mL | ORAL | Status: AC
  Administered 2018-12-06: 30 mL via ORAL
  Filled 2018-12-06: qty 15

## 2018-12-06 MED ORDER — FENTANYL CITRATE (PF) 100 MCG/2ML IJ SOLN: 25.0000 ug | INTRAMUSCULAR | Status: DC | PRN

## 2018-12-06 MED ORDER — DIPHENHYDRAMINE HCL 25 MG PO CAPS
25.0000 mg | ORAL_CAPSULE | Freq: Four times a day (QID) | ORAL | Status: DC | PRN
  Administered 2018-12-06: 25 mg via ORAL
  Filled 2018-12-06: qty 1

## 2018-12-06 MED ORDER — BUPIVACAINE IN DEXTROSE 0.75-8.25 % IT SOLN
INTRATHECAL | Status: DC | PRN
  Administered 2018-12-06: 1.6 mL via INTRATHECAL

## 2018-12-06 MED ORDER — OXYTOCIN 40 UNITS IN LACTATED RINGERS INFUSION - SIMPLE MED
INTRAVENOUS | Status: AC
  Filled 2018-12-06: qty 1000

## 2018-12-06 MED ORDER — LACTATED RINGERS IV BOLUS
1000.0000 mL | Freq: Once | INTRAVENOUS | Status: AC
  Administered 2018-12-06: 1000 mL via INTRAVENOUS

## 2018-12-06 MED ORDER — BUPIVACAINE HCL 0.5 % IJ SOLN: INTRAMUSCULAR | Status: DC | PRN

## 2018-12-06 MED ORDER — PRENATAL MULTIVITAMIN CH
ORAL_TABLET | Freq: Every day | ORAL | Status: DC
  Administered 2018-12-06 – 2018-12-08 (×3): 1 via ORAL
  Filled 2018-12-06 (×3): qty 1

## 2018-12-06 MED ORDER — SODIUM CHLORIDE 0.9 % IV SOLN: INTRAVENOUS | Status: DC

## 2018-12-06 MED ORDER — ONDANSETRON HCL 4 MG/2ML IJ SOLN: 4.0000 mg | Freq: Once | INTRAMUSCULAR | Status: DC | PRN

## 2018-12-06 MED ORDER — MEPERIDINE HCL 25 MG/ML IJ SOLN: 6.2500 mg | INTRAMUSCULAR | Status: DC | PRN

## 2018-12-06 MED ORDER — BUPIVACAINE 0.25 % ON-Q PUMP DUAL CATH 400 ML
400.0000 mL | INJECTION | Status: DC
  Filled 2018-12-06: qty 400

## 2018-12-06 MED ORDER — NALBUPHINE HCL 10 MG/ML IJ SOLN
5.0000 mg | INTRAMUSCULAR | Status: DC | PRN
  Administered 2018-12-06 (×2): 5 mg via INTRAVENOUS
  Filled 2018-12-06 (×2): qty 1

## 2018-12-06 MED ORDER — OXYTOCIN 40 UNITS IN LACTATED RINGERS INFUSION - SIMPLE MED
2.5000 [IU]/h | INTRAVENOUS | Status: AC
  Administered 2018-12-06 (×2): 2.5 [IU]/h via INTRAVENOUS
  Filled 2018-12-06: qty 1000

## 2018-12-06 MED ORDER — NALOXONE HCL 0.4 MG/ML IJ SOLN: 0.4000 mg | INTRAMUSCULAR | Status: DC | PRN

## 2018-12-06 MED ORDER — DIBUCAINE 1 % RE OINT: 1.0000 "application " | TOPICAL_OINTMENT | RECTAL | Status: DC | PRN

## 2018-12-06 MED ORDER — LIDOCAINE HCL 2 % IJ SOLN
INTRAMUSCULAR | Status: AC
  Filled 2018-12-06: qty 10

## 2018-12-06 MED ORDER — KETOROLAC TROMETHAMINE 30 MG/ML IJ SOLN: 30.0000 mg | Freq: Four times a day (QID) | INTRAMUSCULAR | Status: AC | PRN

## 2018-12-06 MED ORDER — EPHEDRINE SULFATE 50 MG/ML IJ SOLN
INTRAMUSCULAR | Status: DC | PRN
  Administered 2018-12-06: 10 mg via INTRAVENOUS

## 2018-12-06 MED ORDER — NALBUPHINE HCL 10 MG/ML IJ SOLN: 5.0000 mg | INTRAMUSCULAR | Status: DC | PRN

## 2018-12-06 MED ORDER — BUPIVACAINE HCL (PF) 0.5 % IJ SOLN
5.0000 mL | Freq: Once | INTRAMUSCULAR | Status: DC
  Filled 2018-12-06: qty 30

## 2018-12-06 MED ORDER — PHENYLEPHRINE HCL 10 MG/ML IJ SOLN
INTRAMUSCULAR | Status: DC | PRN
  Administered 2018-12-06 (×4): 100 ug via INTRAVENOUS

## 2018-12-06 MED ORDER — LACTATED RINGERS IV SOLN
INTRAVENOUS | Status: DC | PRN
  Administered 2018-12-06: 08:00:00 via INTRAVENOUS

## 2018-12-06 MED ORDER — COCONUT OIL OIL
1.0000 "application " | TOPICAL_OIL | Status: DC | PRN
  Administered 2018-12-07: 1 via TOPICAL
  Filled 2018-12-06: qty 120

## 2018-12-06 MED ORDER — KETOROLAC TROMETHAMINE 30 MG/ML IJ SOLN
30.0000 mg | Freq: Four times a day (QID) | INTRAMUSCULAR | Status: AC | PRN
  Administered 2018-12-06: 30 mg via INTRAVENOUS
  Filled 2018-12-06: qty 1

## 2018-12-06 MED ORDER — MORPHINE SULFATE (PF) 0.5 MG/ML IJ SOLN
INTRAMUSCULAR | Status: DC | PRN
  Administered 2018-12-06: .1 mg via EPIDURAL

## 2018-12-06 MED ORDER — ACETAMINOPHEN 500 MG PO TABS
1000.0000 mg | ORAL_TABLET | Freq: Four times a day (QID) | ORAL | Status: AC
  Administered 2018-12-06 – 2018-12-07 (×4): 1000 mg via ORAL
  Filled 2018-12-06 (×4): qty 2

## 2018-12-06 MED ORDER — WITCH HAZEL-GLYCERIN EX PADS: 1.0000 "application " | MEDICATED_PAD | CUTANEOUS | Status: DC | PRN

## 2018-12-06 MED ORDER — VARICELLA VIRUS VACCINE LIVE 1350 PFU/0.5ML IJ SUSR
0.5000 mL | INTRAMUSCULAR | Status: AC | PRN
  Administered 2018-12-08: 0.5 mL via SUBCUTANEOUS
  Filled 2018-12-06: qty 0.5

## 2018-12-06 MED ORDER — FENTANYL CITRATE (PF) 100 MCG/2ML IJ SOLN
INTRAMUSCULAR | Status: AC
  Filled 2018-12-06: qty 2

## 2018-12-06 SURGICAL SUPPLY — 35 items
CANISTER SUCT 3000ML PPV (MISCELLANEOUS) ×2 IMPLANT
CATH KIT ON-Q SILVERSOAK 5 (CATHETERS) ×2 IMPLANT
CATH KIT ON-Q SILVERSOAK 5IN (CATHETERS) ×4 IMPLANT
COVER WAND RF STERILE (DRAPES) ×2 IMPLANT
DERMABOND ADVANCED (GAUZE/BANDAGES/DRESSINGS) ×1
DERMABOND ADVANCED .7 DNX12 (GAUZE/BANDAGES/DRESSINGS) ×1 IMPLANT
DRSG OPSITE POSTOP 4X10 (GAUZE/BANDAGES/DRESSINGS) ×2 IMPLANT
DRSG TELFA 3X8 NADH (GAUZE/BANDAGES/DRESSINGS) ×2 IMPLANT
ELECT CAUTERY BLADE 6.4 (BLADE) ×2 IMPLANT
ELECT REM PT RETURN 9FT ADLT (ELECTROSURGICAL) ×2
ELECTRODE REM PT RTRN 9FT ADLT (ELECTROSURGICAL) ×1 IMPLANT
GAUZE SPONGE 4X4 12PLY STRL (GAUZE/BANDAGES/DRESSINGS) ×2 IMPLANT
GLOVE BIO SURGEON STRL SZ7 (GLOVE) ×5 IMPLANT
GLOVE INDICATOR 7.5 STRL GRN (GLOVE) ×5 IMPLANT
GOWN STRL REUS W/ TWL LRG LVL3 (GOWN DISPOSABLE) ×3 IMPLANT
GOWN STRL REUS W/TWL LRG LVL3 (GOWN DISPOSABLE) ×3
KIT PREVENA INCISION MGT20CM45 (CANNISTER) ×1 IMPLANT
NS IRRIG 1000ML POUR BTL (IV SOLUTION) ×2 IMPLANT
PACK C SECTION AR (MISCELLANEOUS) ×2 IMPLANT
PAD DRESSING TELFA 3X8 NADH (GAUZE/BANDAGES/DRESSINGS) ×1 IMPLANT
PAD OB MATERNITY 4.3X12.25 (PERSONAL CARE ITEMS) ×4 IMPLANT
PAD PREP 24X41 OB/GYN DISP (PERSONAL CARE ITEMS) ×2 IMPLANT
RETRACTOR TRAXI PANNICULUS (MISCELLANEOUS) IMPLANT
RTRCTR C-SECT PINK 34CM XLRG (MISCELLANEOUS) ×1 IMPLANT
STRIP CLOSURE SKIN 1/2X4 (GAUZE/BANDAGES/DRESSINGS) ×2 IMPLANT
SUT MNCRL 4-0 (SUTURE) ×1
SUT MNCRL 4-0 27XMFL (SUTURE) ×1
SUT PDS AB 1 TP1 96 (SUTURE) ×2 IMPLANT
SUT PLAIN GUT 0 (SUTURE) IMPLANT
SUT VIC AB 0 CT1 36 (SUTURE) ×2 IMPLANT
SUT VIC AB 0 CTX 36 (SUTURE) ×2
SUT VIC AB 0 CTX36XBRD ANBCTRL (SUTURE) ×2 IMPLANT
SUTURE MNCRL 4-0 27XMF (SUTURE) ×1 IMPLANT
SWABSTK COMLB BENZOIN TINCTURE (MISCELLANEOUS) ×2 IMPLANT
TRAXI PANNICULUS RETRACTOR (MISCELLANEOUS) ×1

## 2018-12-06 NOTE — Discharge Summary (Addendum)
OB Discharge Summary     Patient Name: Evelyn Wright DOB: [birth year] MRN: [id number]  Date of admission: 12/06/2018 Delivering MD: Thomasene MohairStephen Tanaja Ganger, MD  Date of Delivery: 12/06/2018  Date of discharge: 12/08/2018  Admitting diagnosis:  History of cesarean section, desires repeat, malpresentation of the fetus Intrauterine pregnancy: 303w0d     Secondary diagnosis: morbid obesity (BMI >50)     Discharge diagnosis: Term Pregnancy Delivered                                                                                                Post partum procedures:none  Augmentation: n/a  Complications: None  Hospital course:  Sceduled C/S   37 y.o. yo [id number] at 353w0d was admitted to the hospital 12/06/2018 for scheduled cesarean section with the following indication:Elective Repeat and Malpresentation.  Membrane Rupture Time/Date: 8:42 AM ,12/06/2018   Patient delivered a Viable infant.12/06/2018  Details of operation can be found in separate operative note.  Pateint had an uncomplicated postpartum course.  She was given chemoprophylaxis for VTE during her hospitalization with LMWH.  She is ambulating, tolerating a regular diet, passing flatus, and urinating well. Patient is discharged home in stable condition on  12/08/18.  The patient was discharged on enoxaparin 40 mg [place] daily for 4 weeks for VTE prophylaxis.  The rationale for this medication was explained to the patient and she voiced understanding and agreement with this plan.          Physical exam  Vitals:   12/07/18 1707 12/07/18 2305 12/08/18 0025 12/08/18 0742  BP: 108/70 (!) 120/52 116/72 111/68  Pulse: 70 89  77  Resp: 20 18  20   Temp: 98 F (36.7 C) 98.1 F (36.7 C)  98.7 F (37.1 C)  TempSrc: Oral Oral  Oral  SpO2: 99% 100%  99%  Weight:      Height:       General: alert, cooperative and no distress Lochia: appropriate Uterine Fundus: firm Incision: wound vac in place. No drainage DVT Evaluation: No evidence of  DVT seen on physical exam. No cords or calf tenderness. No significant calf/ankle edema.  Labs: Lab Results  Component Value Date   WBC 13.0 (H) 12/07/2018   HGB 10.8 (L) 12/07/2018   HCT 33.5 (L) 12/07/2018   MCV 88.9 12/07/2018   PLT 203 12/07/2018    Discharge instruction: per After Visit Summary.  Medications:  Allergies as of 12/08/2018   No Known Allergies     Medication List    STOP taking these medications   acetaminophen 500 MG tablet Commonly known as:  TYLENOL     TAKE these medications   aspirin EC 81 MG tablet Take 81 mg by mouth daily.   [employer] FOLIC ACID 800 MCG tablet Generic drug:  folic acid Take 800 mcg by mouth daily.   enoxaparin 40 MG/0.4ML injection Commonly known as:  LOVENOX Inject 0.4 mLs (40 mg total) into the skin daily for 25 days.   ibuprofen 600 MG tablet Commonly known as:  ADVIL,MOTRIN Take 1 tablet (600 mg total) by mouth every 6 (six) hours.  OMEGA-3 PO Take 1 capsule by mouth daily.   oxyCODONE-acetaminophen 5-325 MG tablet Commonly known as:  PERCOCET/ROXICET Take 1 tablet by mouth every 4 (four) hours as needed for moderate pain (pain score 4-7/10).   PRENATAL VITAMIN PO Take 1 tablet by mouth daily.            Discharge Care Instructions  (From admission, onward)         Start     Ordered   12/08/18 0000  Discharge wound care:    Comments:  Perform wound care instructions   12/08/18 1025          Diet: routine diet  Activity: Advance as tolerated. Pelvic rest for 6 weeks.   Outpatient follow up: Follow-up Information    Conard NovakJackson, Koven Belinsky D, MD. Schedule an appointment as soon as possible for a visit in 1 week(s).   Specialty:  Obstetrics and Gynecology Why:  post-op incision check Contact information: [address] [place] [address] [phone]             Postpartum contraception: Progesterone only pills Rhogam Given postpartum: no Rubella vaccine given postpartum:  no Varicella vaccine given postpartum: yes, ordered for postpartum  TDaP given antepartum or postpartum: 10/03/2018 Influenza vaccine given antepartum: 08/22/2018  Newborn Data: Live born female  Birth Weight: 7 lb 7.6 oz (3390 g) APGAR: 7, 8  Newborn Delivery   Birth date/time:  12/06/2018 08:43:00 Delivery type:  C-Section, Low Transverse Trial of labor:  No C-section categorization:  Repeat      Baby Feeding: Breast  Disposition:home with mother  SIGNED: Thomasene MohairStephen Elka Satterfield, MD, Merlinda FrederickFACOG [facility], [facility] 12/08/2018 12:09 PM

## 2018-12-06 NOTE — Op Note (Signed)
Cesarean Section Operative Note    Evelyn Wright   12/06/2018   Pre-operative Diagnosis:  1) History of cesarean section, desires repeat 2) Malpresentation of fetus  3) intrauterine pregnancy at 4042w0d   Post-operative Diagnosis:  1) History of cesarean section, desires repeat 2) Malpresentation of fetus  3) intrauterine pregnancy at 4342w0d    Procedure: Repeat low transverse cesarean section  Surgeon: Surgeon(s) and Role:    * Conard NovakJackson, Jerolyn Flenniken D, MD - Primary    * Natale MilchSchuman, Christanna R, MD - Assisting   Assistants: Dr. Adelene Idlerhristanna Schuman; No other capable assistant available, in surgery requiring high level assistant.  Anesthesia: spinal   Findings:  1) normal appearing gravid uterus, fallopian tubes, and ovaries 2) Viable female infant with APGARs 7 and 8 with weight 3,390 grams (7 lb 8 oz) 3) complete breech presentation   Estimated Blood Loss: 650 mL  Total IV Fluids: 1,200 ml crystalloid  Urine Output: 150 mL clear urine at end of case  Specimens: none  Complications: no complications  Disposition: PACU - hemodynamically stable.   Maternal Condition: stable   Baby condition / location:  Couplet care / Skin to Skin  Procedure Details:  The patient was seen in the Holding Room. The risks, benefits, complications, treatment options, and expected outcomes were discussed with the patient. The patient concurred with the proposed plan, giving informed consent. identified as Evelyn Wright and the procedure verified as C-Section Delivery. A Time Out was held and the above information confirmed.   After induction of anesthesia, the patient was draped and prepped in the usual sterile manner. A Pfannenstiel incision was made and carried down through the subcutaneous tissue to the fascia. Fascial incision was made and extended transversely. The fascia was separated from the underlying rectus tissue superiorly and inferiorly. The peritoneum was identified and entered.  Peritoneal incision was extended longitudinally. The omentum was attached to the lower right rectus abdominus muscle.  It was double clamped with Kelly clamps and transected with scissors. The free edge was tied with a free tie and then a transfixation suture was utilized to ensure the suture did not slip off.  The large Alexis self-retaining retractor was placed once the internal edges were checked and found to be free of any tissue trapped.  The bladder flap was not bluntly or sharply freed from the lower uterine segment. A low transverse uterine incision was made and the hysterotomy was extended with cranial-caudal tension. Delivered from breech (complete) presentation was a 3,390 gram Living newborn infant(s) or Female with Apgar scores of 7 at one minute and 8 at five minutes. Cord ph was not sent the umbilical cord was clamped and cut cord blood was not obtained for evaluation. The placenta was removed Intact and appeared normal. The uterine outline, tubes and ovaries appeared normal. The uterine incision was closed with running locked sutures of 0 Vicryl.  A second layer of the same suture was thrown in an imbricating fashion.  Hemostasis was assured.  The uterus was returned to the abdomen and the paracolic gutters were cleared of all clots and debris.  The Alexis retractor was removed and hemostasis was verified.  The rectus muscles were inspected and found to be hemostatic.    The fascia was then reapproximated with running sutures of 1-0 PDS, looped. The subcutaneous tissue was reapproximated using 2-0 plain gut such that no greater than 2cm of dead space remained.  The subcuticular closure was performed using 4-0 monocryl. The skin closure  was reinforced using benzoin and 1/2" steri-strips.  The Prevena wound vac was placed (20 cm) in accordance with the manufacturer's recommendations.  A seal was verified on the wound vac.    Instrument, sponge, and needle counts were correct prior the abdominal  closure and were correct at the conclusion of the case.  The patient received Ancef 3 gram IV prior to skin incision (within 30 minutes). For VTE prophylaxis she was wearing SCDs throughout the case.  The assistant surgeon was an MD due to lack of availability of another Sales promotion account executivequalified assistant.   Signed: Conard NovakStephen D. Taffie Eckmann, MD 12/06/2018 9:41 AM

## 2018-12-06 NOTE — Anesthesia Post-op Follow-up Note (Signed)
Anesthesia QCDR form completed.        

## 2018-12-06 NOTE — Interval H&P Note (Signed)
History and Physical Interval Note:  12/06/2018 7:40 AM  Evelyn Wright  has presented today for surgery, with the diagnosis of REPEAT CSEAREAN  The various methods of treatment have been discussed with the patient and family. After consideration of risks, benefits and other options for treatment, the patient has consented to  Procedure(s): CESAREAN SECTION (N/A) as a surgical intervention .  The patient's history has been reviewed, patient examined, no change in status, stable for surgery.  I have reviewed the patient's chart and labs.  Questions were answered to the patient's satisfaction.   Thomasene MohairStephen Jackson, MD, Merlinda FrederickFACOG [facility], [facility] 12/06/2018 7:40 AM

## 2018-12-06 NOTE — Anesthesia Procedure Notes (Signed)
Date/Time: 12/06/2018 8:05 AM Performed by: Henrietta HooverPope, Ellary Casamento, CRNA Pre-anesthesia Checklist: Patient identified, Emergency Drugs available, Suction available, Patient being monitored and Timeout performed Oxygen Delivery Method: Nasal cannula Placement Confirmation: positive ETCO2

## 2018-12-06 NOTE — Anesthesia Procedure Notes (Signed)
Spinal  Patient location during procedure: OR Staffing Anesthesiologist: Shaquanta Harkless, MD Performed: anesthesiologist  Preanesthetic Checklist Completed: patient identified, site marked, surgical consent, pre-op evaluation, timeout performed, IV checked and risks and benefits discussed Spinal Block Patient position: sitting Prep: ChloraPrep Patient monitoring: heart rate, cardiac monitor, continuous pulse ox and blood pressure Approach: midline Location: L3-4 Injection technique: single-shot Needle Needle type: Pencil-Tip  Needle gauge: 25 G Needle length: 9 cm Assessment Sensory level: T10     

## 2018-12-06 NOTE — Lactation Note (Signed)
Lactation Consultation Note  Patient Name: Evelyn Wright EXBMW'UToday's Date: 12/06/2018 Reason for consult: Initial assessment   Maternal Data Has patient been taught Hand Expression?: Yes Does the patient have breastfeeding experience prior to this delivery?: Yes  Feeding Feeding Type: Breast Fed  LATCH Score Latch: Grasps breast easily, tongue down, lips flanged, rhythmical sucking.  Audible Swallowing: A few with stimulation  Type of Nipple: Everted at rest and after stimulation  Comfort (Breast/Nipple): Soft / non-tender  Hold (Positioning): No assistance needed to correctly position infant at breast.  LATCH Score: 9  Interventions Interventions: Breast feeding basics reviewed  Lactation Tools Discussed/Used     Consult Status Consult Status: PRN Follow-up type: In-patient    Trudee GripCarolyn P Kaelie Henigan 12/06/2018, 11:36 AM

## 2018-12-06 NOTE — Transfer of Care (Addendum)
1Immediate Anesthesia Transfer of Care Note  Patient: Evelyn Wright  Procedure(s) Performed: CESAREAN SECTION, time of birth [id number], sex: female, weight 7lbs, 8oz, apgar 7,8 (N/A )  Patient Location: PACU and Nursing Unit  Anesthesia Type:Spinal  Level of Consciousness: awake, alert  and oriented  Airway & Oxygen Therapy: Patient Spontanous Breathing  Post-op Assessment: Report given to RN and Post -op Vital signs reviewed and stable  Post vital signs: Reviewed and stable  Last Vitals: 100/48 98 9 100  Value Taken Time                        Last Pain:  Vitals:   12/06/18 0717  TempSrc: Oral  PainSc: 0-No pain         Complications: No apparent anesthesia complications

## 2018-12-06 NOTE — Anesthesia Preprocedure Evaluation (Signed)
Anesthesia Evaluation  Patient identified by MRN, date of birth, ID band Patient awake    Reviewed: Allergy & Precautions, NPO status , Patient's Chart, lab work & pertinent test results, reviewed documented beta blocker date and time   Airway Mallampati: III  TM Distance: >3 FB     Dental  (+) Chipped   Pulmonary           Cardiovascular      Neuro/Psych    GI/Hepatic   Endo/Other  Morbid obesity  Renal/GU      Musculoskeletal   Abdominal   Peds  Hematology   Anesthesia Other Findings ekg OK. eCHO 55-60. She understands that a spinal may not be possible due to obesity.  Reproductive/Obstetrics                             Anesthesia Physical Anesthesia Plan  ASA: III  Anesthesia Plan: Spinal   Post-op Pain Management:    Induction:   PONV Risk Score and Plan:   Airway Management Planned:   Additional Equipment:   Intra-op Plan:   Post-operative Plan:   Informed Consent: I have reviewed the patients History and Physical, chart, labs and discussed the procedure including the risks, benefits and alternatives for the proposed anesthesia with the patient or authorized representative who has indicated his/her understanding and acceptance.     Plan Discussed with: CRNA  Anesthesia Plan Comments:         Anesthesia Quick Evaluation

## 2018-12-07 ENCOUNTER — Encounter: Payer: Self-pay | Admitting: Certified Nurse Midwife

## 2018-12-07 LAB — CBC
HCT: 33.5 % — ABNORMAL LOW (ref 36.0–46.0)
Hemoglobin: 10.8 g/dL — ABNORMAL LOW (ref 12.0–15.0)
MCH: 28.6 pg (ref 26.0–34.0)
MCHC: 32.2 g/dL (ref 30.0–36.0)
MCV: 88.9 fL (ref 80.0–100.0)
PLATELETS: 203 10*3/uL (ref 150–400)
RBC: 3.77 MIL/uL — AB (ref 3.87–5.11)
RDW: 13.4 % (ref 11.5–15.5)
WBC: 13 10*3/uL — ABNORMAL HIGH (ref 4.0–10.5)
nRBC: 0 % (ref 0.0–0.2)

## 2018-12-07 LAB — RPR: RPR Ser Ql: NONREACTIVE

## 2018-12-07 MED ORDER — ACETAMINOPHEN 500 MG PO TABS: 1000.0000 mg | ORAL_TABLET | Freq: Four times a day (QID) | ORAL | Status: DC | PRN

## 2018-12-07 NOTE — Progress Notes (Signed)
POD #1 Repeat CS Subjective:   Doing well. Foley out. Voided without difficulty. Tolerating regular diet. Breast feeding  Objective:  Blood pressure 110/65, pulse 80, temperature 98 F (36.7 C), resp. rate 18, height 5\' 4"  (1.626 m), weight (!) 148.3 kg, last menstrual period 03/08/2018, SpO2 100 %, unknown if currently breastfeeding. IV intake 1804ml and Urine output 1260 ml  General: BF in NAD Heart: RRR without murmur Pulmonary: no increased work of breathing/ CTAB Abdomen: non-distended, soft, non-tender, fundus firm at level of umbilicus-1FB, BS active Incision: Wound vac intact Extremities: trace edema, no erythema, no tenderness  Results for orders placed or performed during the hospital encounter of 12/06/18 (from the past 72 hour(s))  ABO/Rh     Status: None   Collection Time: 12/06/18  6:00 AM  Result Value Ref Range   ABO/RH(D)      B POS Performed at [facility], [address] [place], [place] [address]   RPR     Status: None   Collection Time: 12/06/18  6:00 AM  Result Value Ref Range   RPR Ser Ql Non Reactive Non Reactive    Comment: (NOTE) Performed At: [facility] [address] [place], [place] [id number] Jolene SchimkeNagendra Sanjai MD VH:[id number]:[phone]   CBC     Status: Abnormal   Collection Time: 12/06/18  6:00 AM  Result Value Ref Range   WBC 11.6 (H) 4.0 - 10.5 K/uL   RBC 3.96 3.87 - 5.11 MIL/uL   Hemoglobin 11.3 (L) 12.0 - 15.0 g/dL   HCT 41.335.0 (L) 24.436.0 - 01.046.0 %   MCV 88.4 80.0 - 100.0 fL   MCH 28.5 26.0 - 34.0 pg   MCHC 32.3 30.0 - 36.0 g/dL   RDW 27.213.4 53.611.5 - 64.415.5 %   Platelets 222 150 - 400 K/uL   nRBC 0.0 0.0 - 0.2 %    Comment: Performed at [facility], [address] [place], [place] [address]  Creatinine, serum     Status: None   Collection Time: 12/06/18  6:00 AM  Result Value Ref Range   Creatinine, Ser 0.62 0.44 - 1.00 mg/dL   GFR calc non Af Amer >60 >60 mL/min   GFR calc Af Amer >60 >60 mL/min    Comment: Performed  at [facility], [address] [place], [place] [address]  CBC     Status: Abnormal   Collection Time: 12/07/18  5:21 AM  Result Value Ref Range   WBC 13.0 (H) 4.0 - 10.5 K/uL   RBC 3.77 (L) 3.87 - 5.11 MIL/uL   Hemoglobin 10.8 (L) 12.0 - 15.0 g/dL   HCT 38.733.5 (L) 56.436.0 - 33.246.0 %   MCV 88.9 80.0 - 100.0 fL   MCH 28.6 26.0 - 34.0 pg   MCHC 32.2 30.0 - 36.0 g/dL   RDW 95.113.4 88.411.5 - 16.615.5 %   Platelets 203 150 - 400 K/uL   nRBC 0.0 0.0 - 0.2 %    Comment: Performed at [facility], [address] [place], [place] [address]     Assessment:   37 y.o. [phone] postoperative day # 1-stable  Continue postpartum/ postop care  Ambulate   Plan:  1) Mild blood loss anemia - hemodynamically stable and asymptomatic - po vitamins with iron  2)B POS/ RI/ VNI  3) TDAP given antepartum   4) Breast  5) Contraception?  6) Disposition: discharge on POD #2 or POD #3

## 2018-12-07 NOTE — Anesthesia Postprocedure Evaluation (Signed)
Anesthesia Post Note  Patient: Evelyn Wright  Procedure(Wright) Performed: CESAREAN SECTION, time of birth 760843, sex: female, weight 7lbs, 8oz, apgar 7,8 (N/A )  Patient location during evaluation: PACU Anesthesia Type: Spinal Level of consciousness: oriented and awake and alert Pain management: pain level controlled Vital Signs Assessment: post-procedure vital signs reviewed and stable Respiratory status: spontaneous breathing, respiratory function stable and patient connected to nasal cannula oxygen Cardiovascular status: blood pressure returned to baseline and stable Postop Assessment: no headache, no backache and no apparent nausea or vomiting Anesthetic complications: no     Last Vitals:  Vitals:   12/07/18 0804 12/07/18 0900  BP: 110/65   Pulse: 80   Resp: 18   Temp: 36.7 C   SpO2: 97% 100%    Last Pain:  Vitals:   12/07/18 0425  TempSrc: Oral  PainSc:                  Evelyn Wright

## 2018-12-07 NOTE — Lactation Note (Signed)
This note was copied from a baby's chart. Lactation Consultation Note  Patient Name: Evelyn Wright ZOXWR'UToday's Date: 12/07/2018 Reason for consult: Follow-up assessment   Maternal Data Formula Feeding for Exclusion: No Has patient been taught Hand Expression?: Yes Does the patient have breastfeeding experience prior to this delivery?: Yes Had trouble with latching first child who is now 37 yr old, pumped and bottlefed but had low supply  Feeding Feeding Type: Breast Fed  LATCH Score Latch: Grasps breast easily, tongue down, lips flanged, rhythmical sucking.  Audible Swallowing: A few with stimulation  Type of Nipple: Everted at rest and after stimulation  Comfort (Breast/Nipple): Soft / non-tender  Hold (Positioning): Assistance needed to correctly position infant at breast and maintain latch.  LATCH Score: 8  Interventions Interventions: Breast feeding basics reviewed;Assisted with latch;Hand express;Adjust position;Support pillows  Lactation Tools Discussed/Used WIC Program: No   Consult Status Consult Status: PRN    Dyann KiefMarsha D Kyah Buesing 12/07/2018, 5:54 PM

## 2018-12-08 MED ORDER — OXYCODONE-ACETAMINOPHEN 5-325 MG PO TABS: 1.0000 | ORAL_TABLET | ORAL | 0 refills | Status: DC | PRN

## 2018-12-08 MED ORDER — IBUPROFEN 600 MG PO TABS: 600.0000 mg | ORAL_TABLET | Freq: Four times a day (QID) | ORAL | 0 refills | Status: DC

## 2018-12-08 MED ORDER — ENOXAPARIN SODIUM 40 MG/0.4ML ~~LOC~~ SOLN: 40.0000 mg | SUBCUTANEOUS | 0 refills | Status: DC

## 2018-12-08 NOTE — Progress Notes (Addendum)
Reviewed D/C instructions with pt and family. Pt verbalized understanding of teaching. Discharged to home via W/C. Pt to schedule f/u appt.  Appt scheduled for follow up next week. Patient ID bands verified with mom and security tag removed at time of discharge.

## 2018-12-08 NOTE — Plan of Care (Signed)
Patient's vital signs stable; fundus firm; small amount pink lochia; wound vac intact; voiding; good appetite; good po fluids; breastfeeding infant with good technique observed; patient declined to watch period of Purple Crying DVD; pain controlled with po motrin; husband went home last night; patient attentive to infant; patient ambulates in room independently.

## 2018-12-08 NOTE — Lactation Note (Signed)
This note was copied from a baby's chart. Lactation Consultation Note  Patient Name: Boy Elana AlmQuinreasa Aziz WUJWJ'XToday's Date: 12/08/2018     Maternal Data    Feeding Feeding Type: Breast Fed  LATCH Score                   Interventions    Lactation Tools Discussed/Used     Consult Status  Mother states that breastfeeding is going well and denies any concerns or issues. LC gave mother information on the lactation outpatient clinic here at the hospital if she has any concerns after discharge.    Arlyss Gandylicia Ellise Kovack 12/08/2018, 10:32 AM

## 2018-12-13 ENCOUNTER — Encounter: Payer: Self-pay | Admitting: Obstetrics and Gynecology

## 2018-12-13 ENCOUNTER — Ambulatory Visit (INDEPENDENT_AMBULATORY_CARE_PROVIDER_SITE_OTHER): Payer: BLUE CROSS/BLUE SHIELD | Admitting: Obstetrics and Gynecology

## 2018-12-13 VITALS — BP 128/84 | Ht 64.0 in | Wt 308.0 lb

## 2018-12-13 DIAGNOSIS — Z09 Encounter for follow-up examination after completed treatment for conditions other than malignant neoplasm: Secondary | ICD-10-CM

## 2018-12-13 NOTE — Progress Notes (Signed)
   Postoperative Follow-up Patient presents post op from cesarean section  1 week ago.  Subjective: She denies fever, chills, nausea and vomiting. Eating a regular diet without difficulty. The patient is not having any pain.  Activity: increasing slowy. She denies issues with her incision.  She has maintained her wound vac in place. She has been using her daily lovenox.   Objective: BP 128/84   Ht 5\' 4"  (1.626 m)   Wt (!) 308 lb (139.7 kg)   LMP 03/08/2018 (Exact Date)   BMI 52.87 kg/m   Constitutional: Well nourished, well developed female in no acute distress.  HEENT: normal Skin: Warm and dry.  Abdomen: s, nt, nd, +BS, fundus at U-4 clean, dry, intact and without erythema, induration, warmth, and tenderness, wound vac removed without difficulty.  Benzoin and 1/4" steri-strips placed Extremity: no edema   Assessment: 38 y.o. s/p cesarean section progressing well  Plan: Patient has done well after surgery with no apparent complications.  I have discussed the post-operative course to date, and the expected progress moving forward.  The patient understands what complications to be concerned about.    Activity plan: increase slowly.  No lifting > 20 pounds until 6 week appt  Incision care instructions reviewed.  Return in about 5 weeks (around 01/17/2019) for Six Week Postpartum.  Thomasene Mohair, MD 12/13/2018 9:15 AM

## 2019-01-17 ENCOUNTER — Ambulatory Visit (INDEPENDENT_AMBULATORY_CARE_PROVIDER_SITE_OTHER): Payer: Medicaid Other | Admitting: Obstetrics and Gynecology

## 2019-01-17 ENCOUNTER — Encounter: Payer: Self-pay | Admitting: Obstetrics and Gynecology

## 2019-01-17 DIAGNOSIS — Z30011 Encounter for initial prescription of contraceptive pills: Secondary | ICD-10-CM

## 2019-01-17 DIAGNOSIS — Z98891 History of uterine scar from previous surgery: Secondary | ICD-10-CM

## 2019-01-17 DIAGNOSIS — Z23 Encounter for immunization: Secondary | ICD-10-CM

## 2019-01-17 MED ORDER — VARICELLA VIRUS VACCINE LIVE 1350 PFU/0.5ML IJ SUSR: 0.5000 mL | Freq: Once | INTRAMUSCULAR | 0 refills | Status: AC

## 2019-01-17 MED ORDER — NORETHINDRONE 0.35 MG PO TABS: 1.0000 | ORAL_TABLET | Freq: Every day | ORAL | 4 refills | Status: DC

## 2019-01-17 NOTE — Progress Notes (Signed)
Postpartum Visit  Chief Complaint:  Chief Complaint  Patient presents with  . Postpartum Care    History of Present Illness: Patient is a 38 y.o. [id number] presents for postpartum visit.  Date of delivery: 12/06/2018 Type of delivery: C-Section Episiotomy No.  Laceration: no Pregnancy or labor problems:   Any problems since the delivery:  no  Newborn Details:  SINGLETON :  1. Baby's name: Malcom. Birth weight: 7.8lb Maternal Details:  Breast Feeding:  Breast and Formula Post partum depression/anxiety noted:  no Edinburgh Post-Partum Depression Score:  1  Date of last PAP: 04/04/2018/NORMAL,HPV negative  Past Medical History:  Diagnosis Date  . Anxiety   . No known health problems     Past Surgical History:  Procedure Laterality Date  . CESAREAN SECTION  05/20/2001  . CESAREAN SECTION N/A 12/06/2018   Procedure: CESAREAN SECTION, time of birth 52, sex: female, weight 7lbs, 8oz, apgar 7,8;  Surgeon: Conard Novak, MD;  Location: [facility] ORS;  Service: Obstetrics;  Laterality: N/A;  . DILATION AND CURETTAGE OF UTERUS    . DILATION AND EVACUATION N/A 09/22/2015   Procedure: DILATATION AND EVACUATION;  Surgeon: Vena Austria, MD;  Location: [facility] ORS;  Service: Gynecology;  Laterality: N/A;  . KNEE ARTHROSCOPY Right 2015    Prior to Admission medications   Medication Sig Start Date End Date Taking? Authorizing Provider  Prenatal Vit-Fe Fumarate-FA (PRENATAL VITAMIN PO) Take 1 tablet by mouth daily.    Yes [provider]   Allergies: No Known Allergies   Social History   Socioeconomic History  . Marital status: Married    Spouse name: Hansel Starling  . Number of children: Not on file  . Years of education: Not on file  . Highest education level: Not on file  Occupational History  . Occupation: massage therapist  Social Needs  . Financial resource strain: Not on file  . Food insecurity:    Worry: Not on file    Inability: Not on file  . Transportation  needs:    Medical: Not on file    Non-medical: Not on file  Tobacco Use  . Smoking status: Never Smoker  . Smokeless tobacco: Never Used  Substance and Sexual Activity  . Alcohol use: No  . Drug use: No  . Sexual activity: Yes    Birth control/protection: None  Lifestyle  . Physical activity:    Days per week: Not on file    Minutes per session: Not on file  . Stress: Not on file  Relationships  . Social connections:    Talks on phone: Not on file    Gets together: Not on file    Attends religious service: Not on file    Active member of club or organization: Not on file    Attends meetings of clubs or organizations: Not on file    Relationship status: Not on file  . Intimate partner violence:    Fear of current or ex partner: Not on file    Emotionally abused: Not on file    Physically abused: Not on file    Forced sexual activity: Not on file  Other Topics Concern  . Not on file  Social History Narrative  . Not on file    Family History  Problem Relation Age of Onset  . Epilepsy Sister     Review of Systems  Constitutional: Negative.   HENT: Negative.   Eyes: Negative.   Respiratory: Negative.   Cardiovascular: Negative.   Gastrointestinal: Negative.  Genitourinary: Negative.   Musculoskeletal: Negative.   Skin: Negative.   Neurological: Negative.   Psychiatric/Behavioral: Negative.      Physical Exam BP 128/88   Ht 5\' 4"  (1.626 m)   Wt (!) 302 lb (137 kg)   LMP 03/08/2018 (Exact Date)   BMI 51.84 kg/m   Physical Exam Constitutional:      General: She is not in acute distress.    Appearance: Normal appearance. She is well-developed.  Genitourinary:     Pelvic exam was performed with patient supine.     Vulva, inguinal canal, urethra, bladder, vagina, uterus, right adnexa and left adnexa normal.     No posterior fourchette tenderness, injury or lesion present.     No cervical friability, lesion, bleeding or polyp.     Genitourinary Comments:  Bimanual limited by body habitus  HENT:     Head: Normocephalic and atraumatic.  Eyes:     General: No scleral icterus.    Conjunctiva/sclera: Conjunctivae normal.  Neck:     Musculoskeletal: Normal range of motion and neck supple.  Cardiovascular:     Rate and Rhythm: Normal rate and regular rhythm.     Heart sounds: No murmur. No friction rub. No gallop.   Pulmonary:     Effort: Pulmonary effort is normal. No respiratory distress.     Breath sounds: Normal breath sounds. No wheezing or rales.  Abdominal:     General: Bowel sounds are normal. There is no distension.     Palpations: Abdomen is soft. There is no mass.     Tenderness: There is no abdominal tenderness. There is no guarding or rebound.     Comments: without erythema, induration, warmth, and tenderness. It is clean, dry, and intact.    Musculoskeletal: Normal range of motion.  Neurological:     General: No focal deficit present.     Mental Status: She is alert and oriented to person, place, and time.     Cranial Nerves: No cranial nerve deficit.  Skin:    General: Skin is warm and dry.     Findings: No erythema.  Psychiatric:        Mood and Affect: Mood normal.        Behavior: Behavior normal.        Judgment: Judgment normal.    Female Chaperone present during breast and/or pelvic exam.  Assessment: 38 y.o. [id number] presenting for 6 week postpartum visit  Plan: Problem List Items Addressed This Visit      Other   History of cesarean section    Other Visit Diagnoses    Postpartum care and examination    -  Primary   Relevant Medications   varicella virus vaccine live (VARIVAX) 1350 PFU/0.5ML INJ injection   norethindrone (MICRONOR,CAMILA,ERRIN) 0.35 MG tablet   Encounter for initial prescription of contraceptive pills       Relevant Medications   norethindrone (MICRONOR,CAMILA,ERRIN) 0.35 MG tablet   Need for prophylactic vaccination and inoculation against varicella       Relevant Medications    varicella virus vaccine live (VARIVAX) 1350 PFU/0.5ML INJ injection     1) Contraception Education given regarding options for contraception, including oral contraceptives.  2)  Pap - ASCCP guidelines and rational discussed.  Patient opts for routine screening interval  3) Patient underwent screening for postpartum depression with no concerns noted.  4) second dose of varicella vaccine ordered today, as she did not demonstrate immunity at her NOB labs.  5) Follow up  1 year for routine annual exam  Thomasene MohairStephen Charlei Ramsaran, MD 01/17/2019 10:32 AM

## 2019-01-24 ENCOUNTER — Telehealth: Payer: Self-pay

## 2019-01-24 NOTE — Telephone Encounter (Signed)
I have confirmation that it was sent to [facility]. Is that where she went?

## 2019-01-24 NOTE — Telephone Encounter (Signed)
Pt states at her last visit the 2nd dose of chicken pox vaccine was ordered.  When she went to her pharm to get it they did not have the order.  Does she need to come to office to get vac?  [phone]

## 2019-01-25 NOTE — Telephone Encounter (Signed)
Pt aware and will call back if they dont have it

## 2019-03-11 NOTE — Telephone Encounter (Signed)
Can you ask her what she's been on before that worked well for her? I'm happy to call her in something.Marland KitchenMarland Kitchen

## 2019-03-12 ENCOUNTER — Other Ambulatory Visit: Payer: Self-pay | Admitting: Obstetrics and Gynecology

## 2019-03-12 DIAGNOSIS — Z3041 Encounter for surveillance of contraceptive pills: Secondary | ICD-10-CM

## 2019-03-12 MED ORDER — LEVONORGEST-ETH ESTRAD 91-DAY 0.15-0.03 MG PO TABS: 1.0000 | ORAL_TABLET | Freq: Every day | ORAL | 4 refills | Status: AC

## 2020-01-23 ENCOUNTER — Emergency Department
Admission: EM | Admit: 2020-01-23 | Discharge: 2020-01-23 | Disposition: A | Discharge status: Home / Self Care | Payer: Medicaid Other | Attending: Emergency Medicine | Admitting: Emergency Medicine

## 2020-01-23 ENCOUNTER — Encounter: Payer: Self-pay | Admitting: Emergency Medicine

## 2020-01-23 ENCOUNTER — Other Ambulatory Visit: Payer: Self-pay

## 2020-01-23 ENCOUNTER — Emergency Department: Payer: Medicaid Other

## 2020-01-23 DIAGNOSIS — Z79899 Other long term (current) drug therapy: Secondary | ICD-10-CM | POA: Insufficient documentation

## 2020-01-23 DIAGNOSIS — Z793 Long term (current) use of hormonal contraceptives: Secondary | ICD-10-CM | POA: Insufficient documentation

## 2020-01-23 DIAGNOSIS — M79661 Pain in right lower leg: Secondary | ICD-10-CM | POA: Diagnosis present

## 2020-01-23 MED ORDER — CYCLOBENZAPRINE HCL 5 MG PO TABS: ORAL_TABLET | ORAL | 0 refills | Status: AC

## 2020-01-23 NOTE — ED Triage Notes (Addendum)
Patient presents to the ED with pain in her right lower leg.  Patient states she has had some swelling in her right leg x 1 year after she started regularly working out.  Patient states she has had knee surgery to her right leg in the past.  Patient denies smoking or taking birth control.  Patient denies history of blood clots.  Patient reports being on an 1.5 hour flight recently.

## 2020-01-23 NOTE — ED Provider Notes (Signed)
[facility] Emergency Department Provider Note  ____________________________________________  Time seen: Approximately 7:55 PM  I have reviewed the triage vital signs and the nursing notes.   HISTORY  Chief Complaint Leg Pain    HPI STEFHANIE Wright is a 39 y.o. female that presents to the emergency department for evaluation of right lower leg pain for 1 day.  Patient is having pain to the outside of her right upper calf.  Patient states that she feels a "pulling" sensation.  Patient states that pain is worse when she is up walking on her leg.  She is not having any pain when she is sitting still.  Pain and pulling sensation does not radiate outside of this small area to her upper calf.  She does not have any pain to the thigh, inside of her leg, knee, ankle.  No ankle swelling.  She does have 2 small bruises to the area.  No leg cramping.  Patient has been working out and is mostly doing low intensity workouts.  She was walking when the pain started.  No falls. No hx of DVT. No SOB,  numbness, tingling.    Past Medical History:  Diagnosis Date  . Anxiety   . No known health problems     Patient Active Problem List   Diagnosis Date Noted  . History of cesarean delivery 12/06/2018  . Malpresentation of fetus 12/06/2018  . Fetal echogenic intracardiac focus on prenatal ultrasound 08/16/2018  . Family history of congenital heart defect 07/14/2018  . H/O pre-eclampsia in prior pregnancy, currently pregnant 05/30/2018  . 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 Supervision of high risk pregnancy, antepartum 04/04/2018  . Obesity affecting pregnancy 04/04/2018  . Morbid obesity with BMI of 50.0-59.9, adult (HCC) 04/04/2018  . Advanced maternal age in multigravida 04/04/2018  . First trimester bleeding 04/04/2018  . Pregnancy complicated by previous recurrent miscarriages 04/04/2018  . History of cesarean section  09/20/2017  . Class 3 severe obesity without serious comorbidity with body mass index (BMI) of 40.0 to 44.9 in adult [facility]) 09/20/2017    Past Surgical History:  Procedure Laterality Date  . CESAREAN SECTION  05/20/2001  . CESAREAN SECTION N/A 12/06/2018   Procedure: CESAREAN SECTION, time of birth 68, sex: female, weight 7lbs, 8oz, apgar 7,8;  Surgeon: Conard Novak, MD;  Location: [facility] ORS;  Service: Obstetrics;  Laterality: N/A;  . DILATION AND CURETTAGE OF UTERUS    . DILATION AND EVACUATION N/A 09/22/2015   Procedure: DILATATION AND EVACUATION;  Surgeon: Vena Austria, MD;  Location: [facility] ORS;  Service: Gynecology;  Laterality: N/A;  . KNEE ARTHROSCOPY Right 2015    Prior to Admission medications   Medication Sig Start Date End Date Taking? Authorizing Provider  levonorgestrel-ethinyl estradiol (SEASONALE,INTROVALE,JOLESSA) 0.15-0.03 MG tablet Take 1 tablet by mouth daily. 03/12/19   Conard Novak, MD  Prenatal Vit-Fe Fumarate-FA (PRENATAL VITAMIN PO) Take 1 tablet by mouth daily.     [provider]    Allergies Patient has no known allergies.  Family History  Problem Relation Age of Onset  . Epilepsy Sister     Social History Social History   Tobacco Use  . Smoking status: Never Smoker  . Smokeless tobacco: Never Used  Substance Use Topics  . Alcohol use: No  . Drug use: No     Review of Systems  Constitutional: No fever/chills Cardiovascular: No chest pain. Respiratory: No SOB. Gastrointestinal: No abdominal pain.  No  nausea, no vomiting.  Musculoskeletal: Positive for calf pain. Skin: Negative for rash, abrasions, lacerations. Positive for small ecchymosis. Neurological: Negative for headaches, numbness or tingling   ____________________________________________   PHYSICAL EXAM:  VITAL SIGNS: ED Triage Vitals  Enc Vitals Group     BP 01/23/20 1813 128/64     Pulse Rate 01/23/20 1813 71     Resp 01/23/20 1813 16     Temp  01/23/20 1813 98.3 F (36.8 C)     Temp Source 01/23/20 1813 Oral     SpO2 01/23/20 1813 100 %     Weight 01/23/20 1814 (!) 305 lb (138.3 kg)     Height 01/23/20 1814 5\' 4"  (1.626 m)     Head Circumference --      Peak Flow --      Pain Score 01/23/20 1813 0     Pain Loc --      Pain Edu? --      Excl. in [place]? --      Constitutional: Alert and oriented. Well appearing and in no acute distress. Eyes: Conjunctivae are normal. PERRL. EOMI. Head: Atraumatic. ENT:      Ears:      Nose: No congestion/rhinnorhea.      Mouth/Throat: Mucous membranes are moist.  Neck: No stridor.  Cardiovascular: Normal rate, regular rhythm.  Good peripheral circulation. Respiratory: Normal respiratory effort without tachypnea or retractions. Lungs CTAB. Good air entry to the bases with no decreased or absent breath sounds. Gastrointestinal: Bowel sounds 4 quadrants. Soft and nontender to palpation. No guarding or rigidity. No palpable masses. No distention. Musculoskeletal: Full range of motion to all extremities. No gross deformities appreciated. Neurologic:  Normal speech and language. No gross focal neurologic deficits are appreciated.  Skin:  Skin is warm, dry and intact. No rash noted. Psychiatric: Mood and affect are normal. Speech and behavior are normal. Patient exhibits appropriate insight and judgement.   ____________________________________________   LABS (all labs ordered are listed, but only abnormal results are displayed)  Labs Reviewed - No data to display ____________________________________________  EKG   ____________________________________________  RADIOLOGY Robinette Haines, personally viewed and evaluated these images (plain radiographs) as part of my medical decision making, as well as reviewing the written report by the radiologist.  DG Tibia/Fibula Right  Result Date: 01/23/2020 CLINICAL DATA:  Leg pain and bruising EXAM: RIGHT TIBIA AND FIBULA - 2 VIEW COMPARISON:   None. FINDINGS: There is no evidence of fracture or other focal bone lesions. Mild pretibial soft tissue swelling is seen. IMPRESSION: Negative. Electronically Signed   By: Prudencio Pair M.D.   On: 01/23/2020 20:44   US Venous Img Lower Unilateral Right  Result Date: 01/23/2020 CLINICAL DATA:  Pain, swelling right leg EXAM: RIGHT LOWER EXTREMITY VENOUS DOPPLER ULTRASOUND TECHNIQUE: Gray-scale sonography with compression, as well as color and duplex ultrasound, were performed to evaluate the deep venous system(s) from the level of the common femoral vein through the popliteal and proximal calf veins. COMPARISON:  None. FINDINGS: VENOUS Normal compressibility of the common femoral, superficial femoral, and popliteal veins, as well as the visualized calf veins. Visualized portions of profunda femoral vein and great saphenous vein unremarkable. No filling defects to suggest DVT on grayscale or color Doppler imaging. Doppler waveforms show normal direction of venous flow, normal respiratory phasicity and response to augmentation. Limited views of the contralateral common femoral vein are unremarkable. OTHER None. Limitations: none IMPRESSION: No evidence of right lower extremity DVT. Electronically Signed  By: Charlett Nose M.D.   On: 01/23/2020 19:20    ____________________________________________    PROCEDURES  Procedure(s) performed:    Procedures    Medications - No data to display   ____________________________________________   INITIAL IMPRESSION / ASSESSMENT AND PLAN / ED COURSE  Pertinent labs & imaging results that were available during my care of the patient were reviewed by me and considered in my medical decision making (see chart for details).  Review of the [place] CSRS was performed in accordance of the [facility] prior to dispensing any controlled drugs.    Patient presented to emergency department for evaluation of calf pain for 1 day.  Vital signs and exam are reassuring.   Ultrasound negative for DVT.  X-ray negative for acute bony abnormalities.  Patient likely has pulled a muscle exercising.  Patient is to follow up with PCP as directed. Patient is given ED precautions to return to the ED for any worsening or new symptoms.   Evelyn Wright was evaluated in Emergency Department on 01/23/2020 for the symptoms described in the history of present illness. She was evaluated in the context of the global COVID-19 pandemic, which necessitated consideration that the patient might be at risk for infection with the SARS-CoV-2 virus that causes COVID-19. Institutional protocols and algorithms that pertain to the evaluation of patients at risk for COVID-19 are in a state of rapid change based on information released by regulatory bodies including the CDC and federal and state organizations. These policies and algorithms were followed during the patient's care in the ED.  ____________________________________________  FINAL CLINICAL IMPRESSION(S) / ED DIAGNOSES  Final diagnoses:  Right calf pain      NEW MEDICATIONS STARTED DURING THIS VISIT:  ED Discharge Orders    None          This chart was dictated using voice recognition software/Dragon. Despite best efforts to proofread, errors can occur which can change the meaning. Any change was purely unintentional.    Enid Derry, PA-C 01/23/20 2315    Chesley Noon, MD 01/23/20 [phone]

## 2020-04-23 IMAGING — US US MFM OB FOLLOW-UP
1 series · 13 of 28 positions shown · non-contrast
Comparison: none

PATIENT INFO:

PERFORMED BY:
SERVICE(S) PROVIDED:
INDICATIONS:
29 weeks gestation of pregnancy
Growth assessment
Maternal obesity
FETAL EVALUATION:
Num Of Fetuses:         1
Cardiac Activity:       Present
Presentation:           Transverse, head maternal left
Placenta:               Posterior, no previa
Amniotic Fluid
AFI FV:      Within normal limits
BIOMETRY:
BPD:      74.4  mm     G. Age:  29w 6d         46  %    CI:        71.75   %    70 - 86
FL/HC:      20.2   %    19.2 -
HC:      279.6  mm     G. Age:  30w 4d         46  %    HC/AC:      1.11        0.99 -
AC:      252.6  mm     G. Age:  29w 3d         41  %    FL/BPD:     76.1   %    71 - 87
FL:       56.6  mm     G. Age:  29w 5d         40  %    FL/AC:      22.4   %    20 - 24
HUM:      52.8  mm     G. Age:  30w 5d         72  %
Est. FW:    2930  gm      3 lb 3 oz     43  %
GESTATIONAL AGE:
LMP:           29w 4d        Date:  03/08/18                 EDD:   12/13/18
U/S Today:     29w 6d                                        EDD:   12/11/18
Best:          29w 4d     Det. By:  LMP  (03/08/18)          EDD:   12/13/18
ANATOMY:
Cranium:               Within Normal Limits   Diaphragm:              Within Normal Limits
Cavum:                 Normal appearance      Stomach:                Seen
Ventricles:            Normal appearance      Abdomen:                Normal appearance
Choroid Plexus:        Within Normal Limits   Abdominal Wall:         Normal appearance
Cerebellum:            Within Normal Limits   Cord Vessels:           Within Normal
Limits, 3 vessels
Posterior Fossa:       Within Normal Limits   Kidneys:                Normal appearance
Nuchal Fold:           Beyond 22 weeks        Bladder:                Seen
gestation
Face:                  Suboptimal             Spine:                  Normal appearance
Lips:                  Seen on prior          Upper Extremities:      Suboptimally
visualized
Heart:                 4-Chamber view         Lower Extremities:      Suboptimally
appears normal                                 visualized
CERVIX UTERUS ADNEXA:
Cervix
Length:            3.4  cm.

[Series 1: us mfm ob follow-up · 0.30mm/px · 13 of 53 slices shown]
[im 2/53]
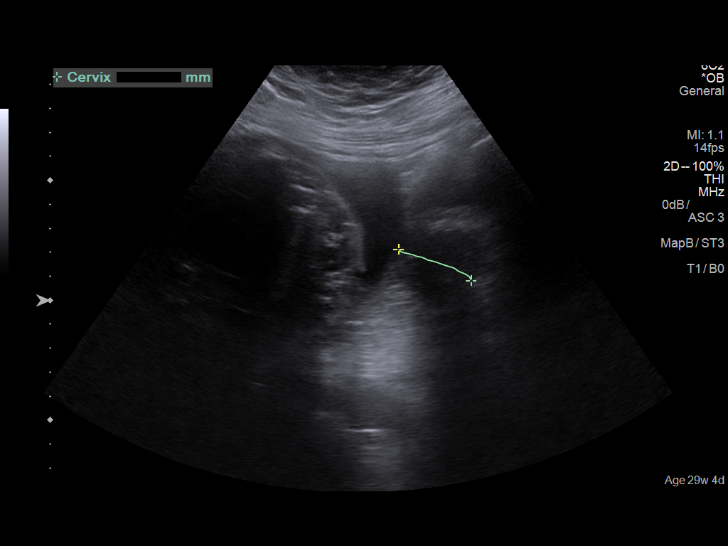
[im 6/53]
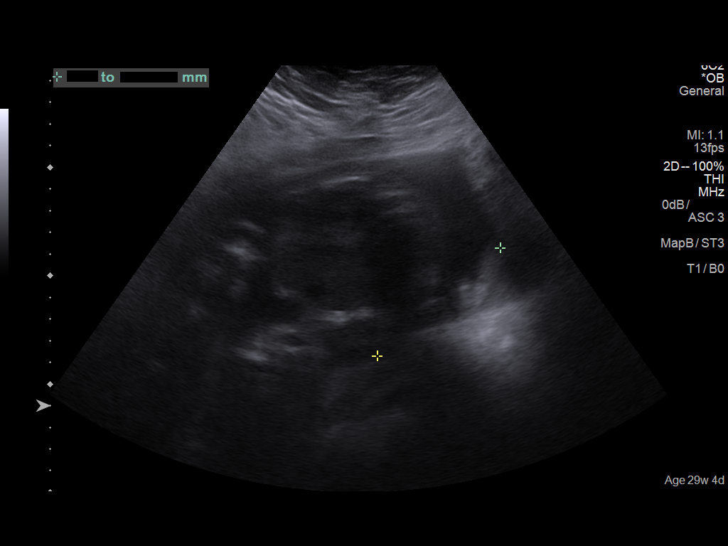
[im 10/53]
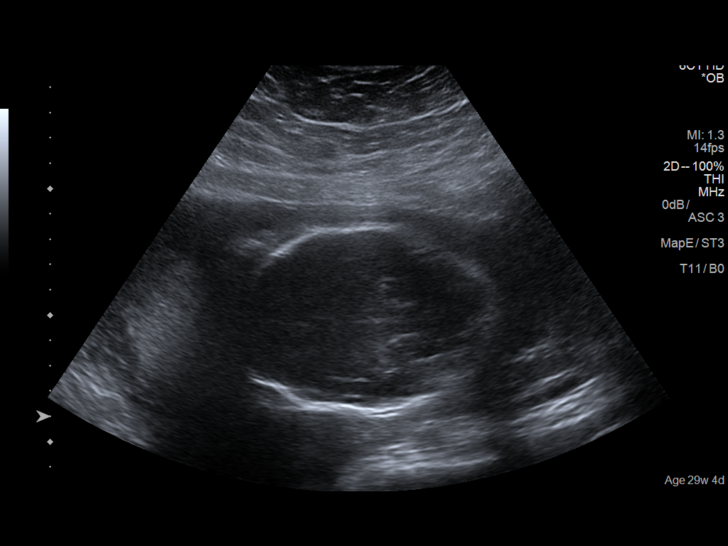
[im 14/53]
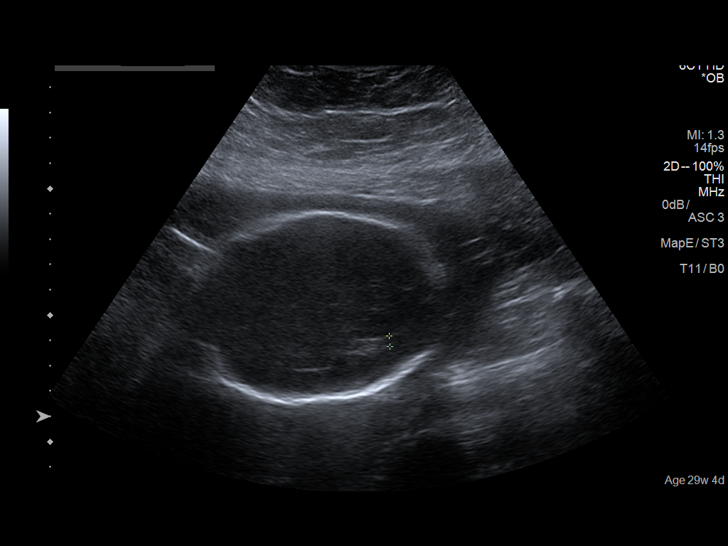
[im 18/53]
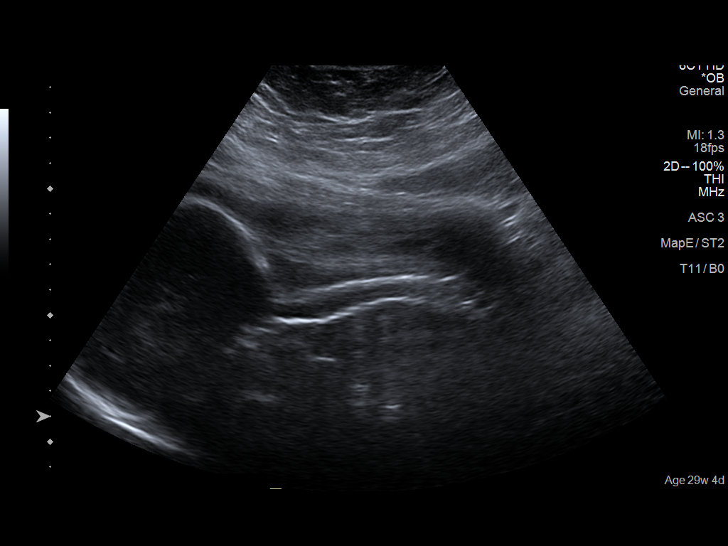
[im 22/53]
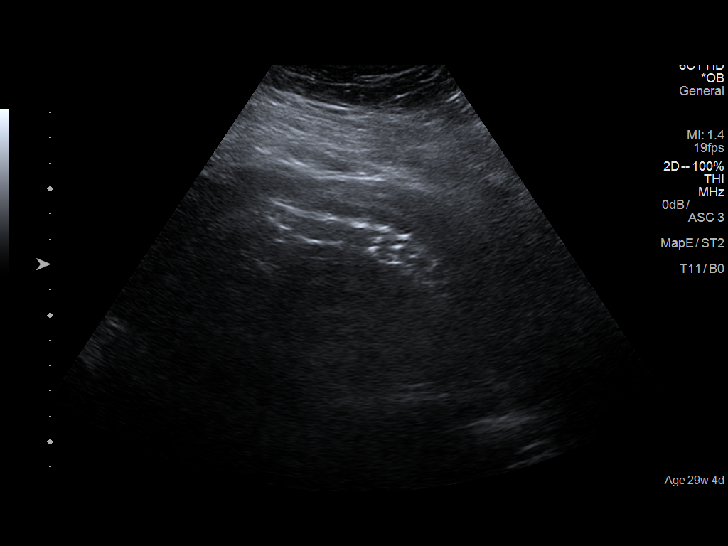
[im 27/53]
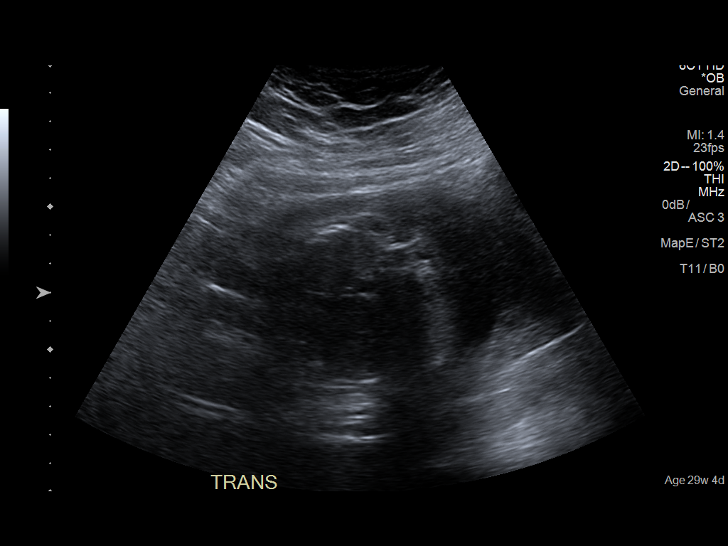
[im 31/53]
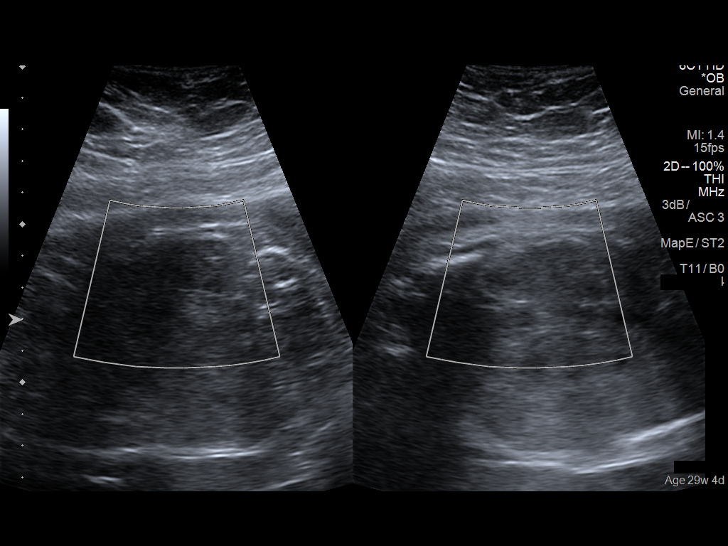
[im 35/53]
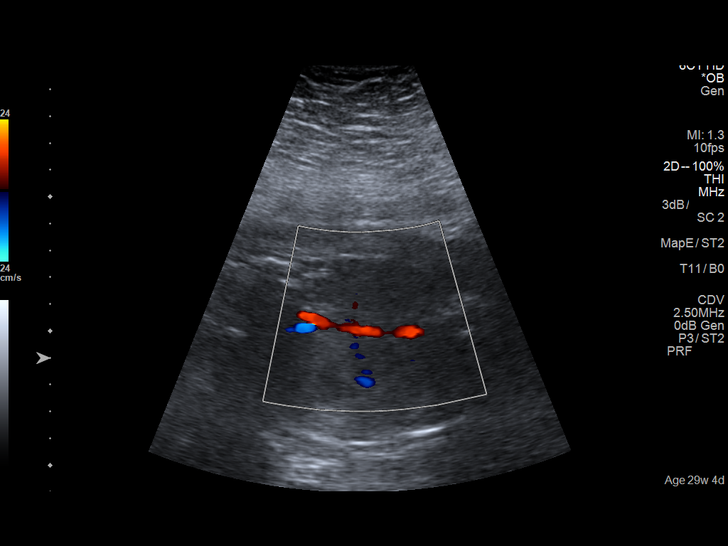
[im 39/53]
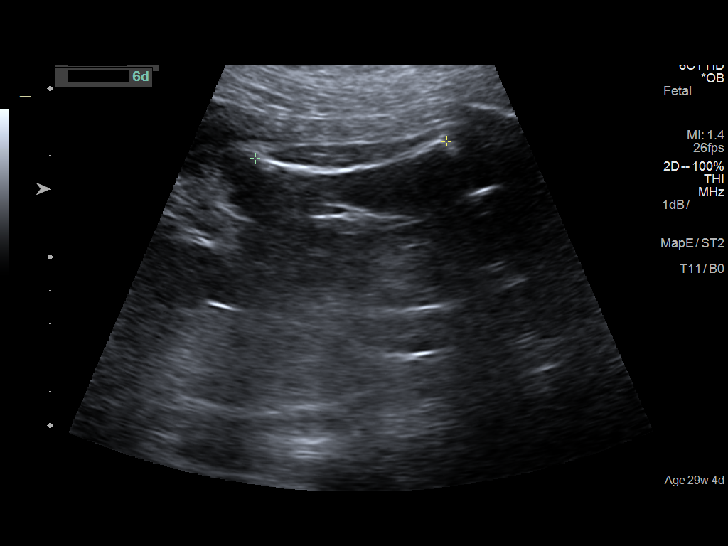
[im 43/53]
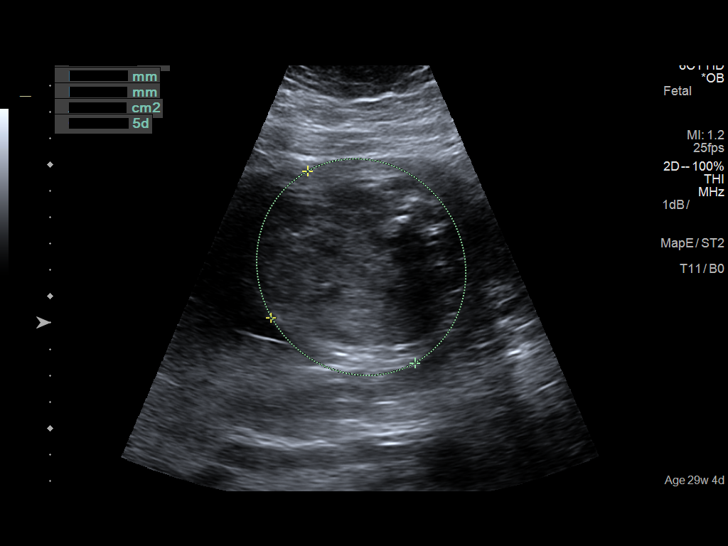
[im 47/53]
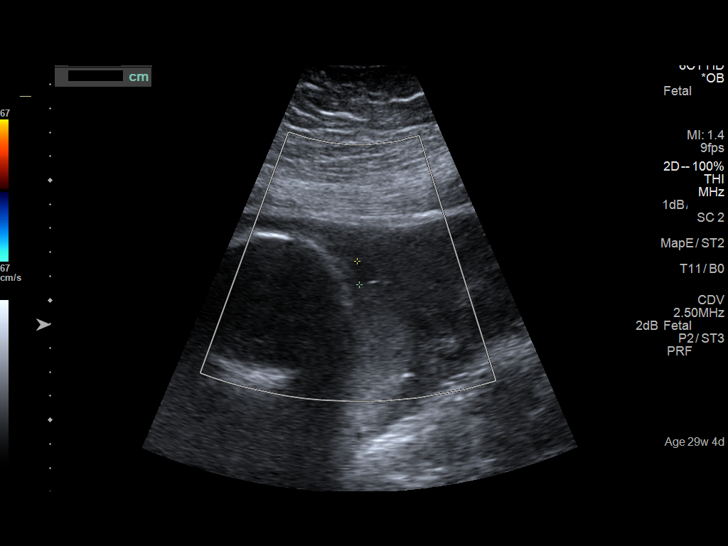
[im 51/53]
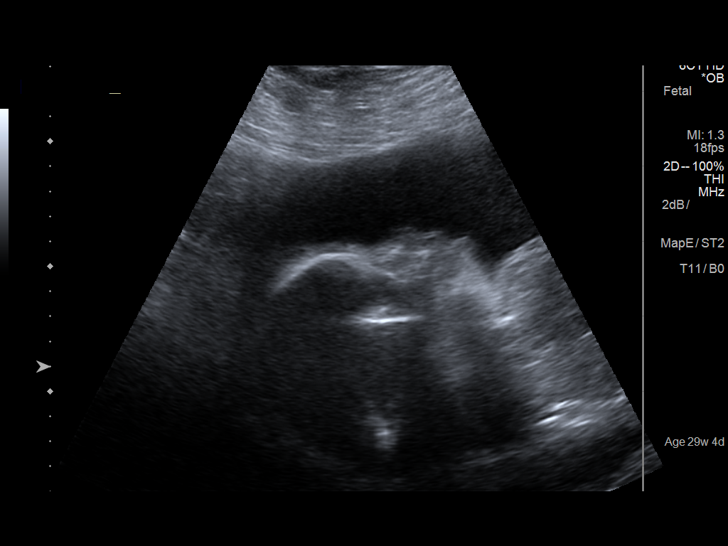

[13 of 28 positions shown; findings below may reference images not displayed]

IMPRESSION: Ultrsaound demonstrates a single live pregnancy at 29 [DATE]
weeks. Dating is by LMP consistent with earliest available
ultrasound performed at [facility] on 04/25/18 with
measurements of 7 0/7 weeks.  Ms. Eya Eya had a normal
fetal echo with [facility] on 08/16/18, though
the study was technically difficult.

Visualization of fetal anatomy is overall limited by maternal
acoustics.  The fetal growth is appropriate.

The placenta is posterior and no longer low-lying as it
measures 7.6 cm from the internal os. The amniotic fluid
volume is normal.

Recommend monthly assessment of fetal growth.   A
followup appointment was not provided but please contact us
should you desire us to continue to perform her growth scans.

## 2021-01-24 IMAGING — DX DG TIBIA/FIBULA 2V*R*
2 series · 2 of 2 positions shown · non-contrast
Comparison: None.

CLINICAL DATA: Leg pain and bruising

EXAM:
RIGHT TIBIA AND FIBULA - 2 VIEW

[tibia ap]
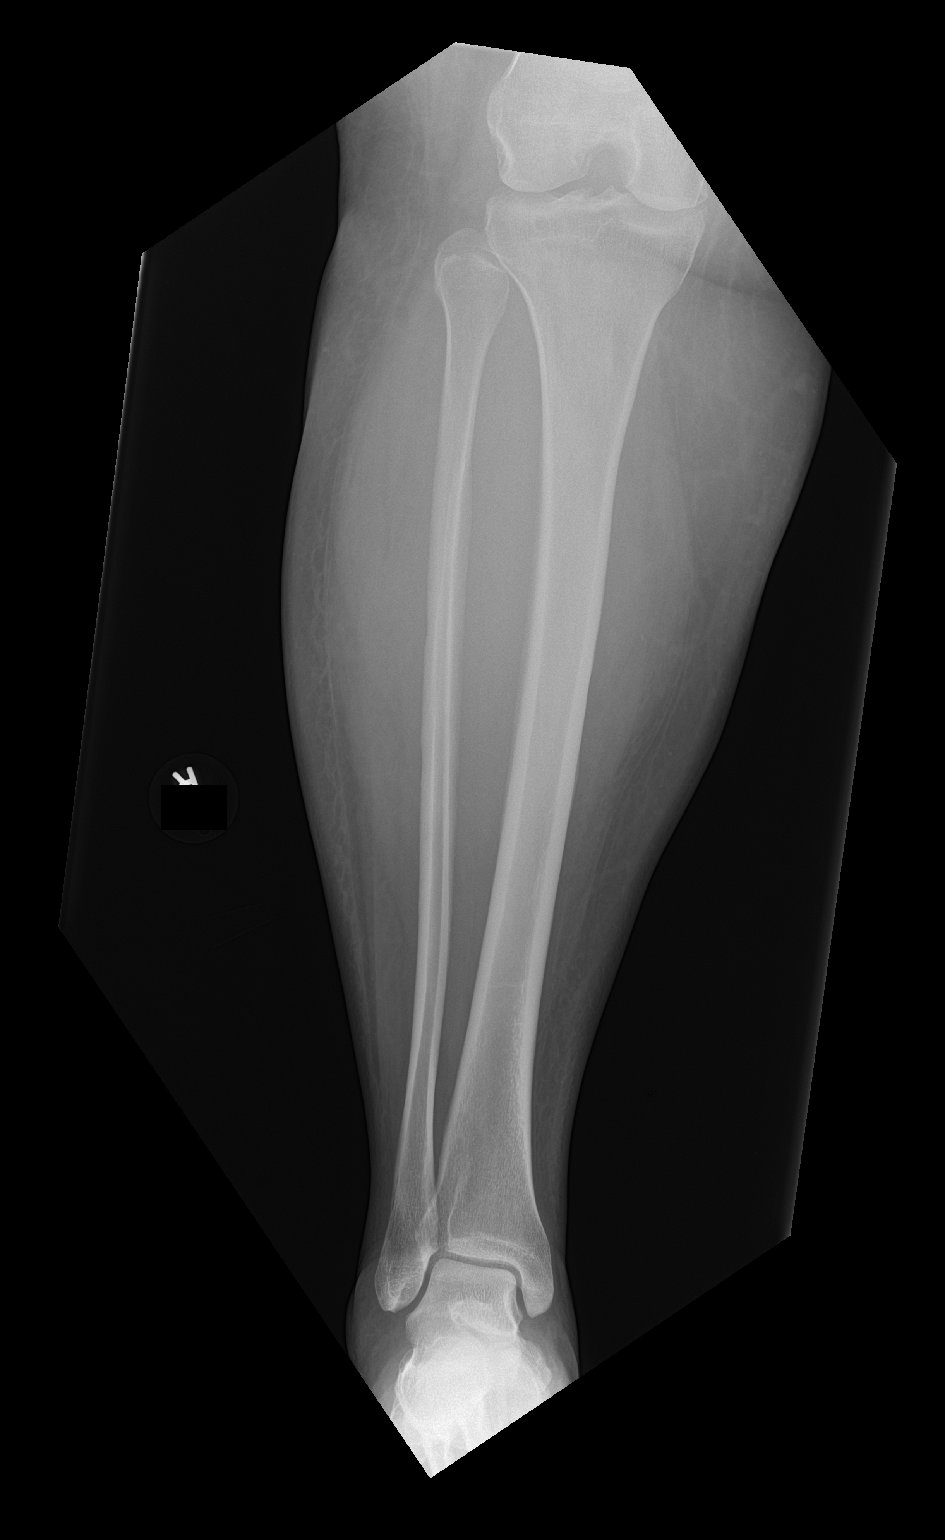

[tibia lat]
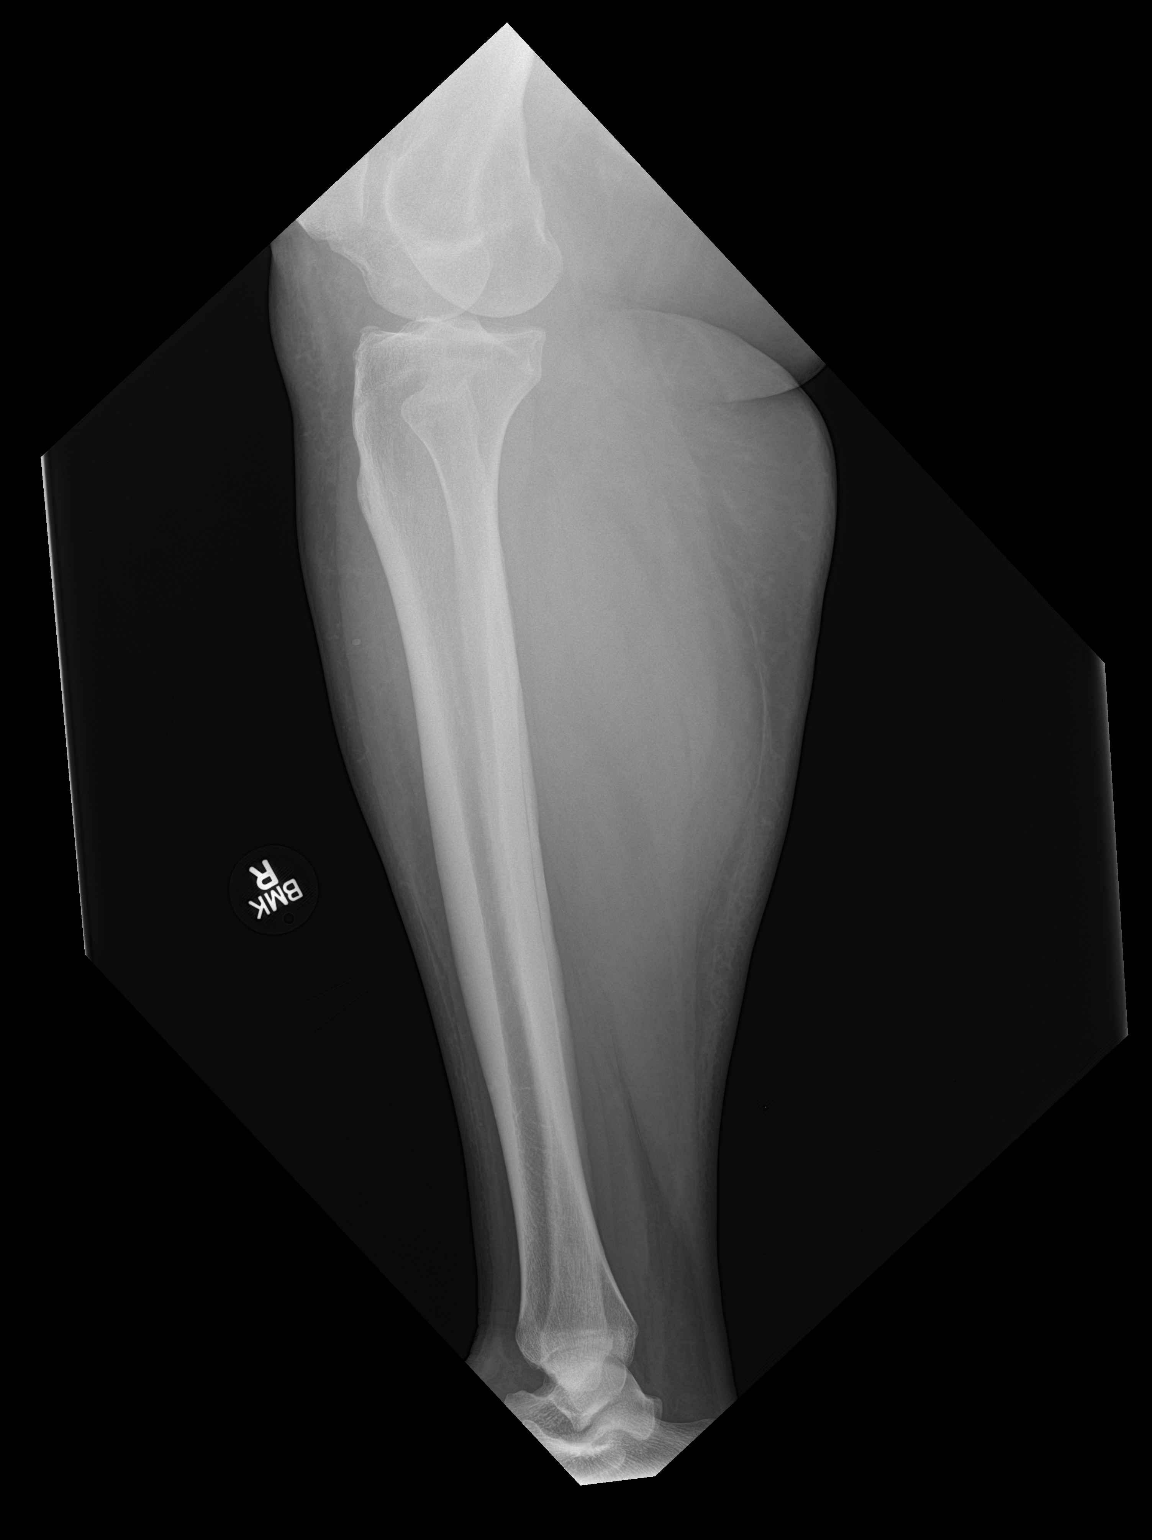

[2 of 2 positions shown; findings below may reference images not displayed]

FINDINGS: There is no evidence of fracture or other focal bone lesions. Mild
pretibial soft tissue swelling is seen.
IMPRESSION: Negative.

## 2021-12-30 DIAGNOSIS — R69 Illness, unspecified: Secondary | ICD-10-CM | POA: Diagnosis not present

## 2021-12-30 DIAGNOSIS — Z0000 Encounter for general adult medical examination without abnormal findings: Secondary | ICD-10-CM | POA: Diagnosis not present

## 2021-12-30 DIAGNOSIS — Z23 Encounter for immunization: Secondary | ICD-10-CM | POA: Diagnosis not present

## 2022-02-08 DIAGNOSIS — R69 Illness, unspecified: Secondary | ICD-10-CM | POA: Diagnosis not present

## 2022-02-08 DIAGNOSIS — F331 Major depressive disorder, recurrent, moderate: Secondary | ICD-10-CM | POA: Diagnosis not present

## 2022-02-15 DIAGNOSIS — R69 Illness, unspecified: Secondary | ICD-10-CM | POA: Diagnosis not present

## 2022-02-15 DIAGNOSIS — F331 Major depressive disorder, recurrent, moderate: Secondary | ICD-10-CM | POA: Diagnosis not present

## 2022-02-22 DIAGNOSIS — F331 Major depressive disorder, recurrent, moderate: Secondary | ICD-10-CM | POA: Diagnosis not present

## 2022-02-22 DIAGNOSIS — R69 Illness, unspecified: Secondary | ICD-10-CM | POA: Diagnosis not present

## 2022-02-24 DIAGNOSIS — J029 Acute pharyngitis, unspecified: Secondary | ICD-10-CM | POA: Diagnosis not present

## 2022-03-01 DIAGNOSIS — F331 Major depressive disorder, recurrent, moderate: Secondary | ICD-10-CM | POA: Diagnosis not present

## 2022-03-01 DIAGNOSIS — R69 Illness, unspecified: Secondary | ICD-10-CM | POA: Diagnosis not present

## 2022-03-08 DIAGNOSIS — F331 Major depressive disorder, recurrent, moderate: Secondary | ICD-10-CM | POA: Diagnosis not present

## 2022-03-08 DIAGNOSIS — R69 Illness, unspecified: Secondary | ICD-10-CM | POA: Diagnosis not present

## 2022-03-15 DIAGNOSIS — F331 Major depressive disorder, recurrent, moderate: Secondary | ICD-10-CM | POA: Diagnosis not present

## 2022-03-15 DIAGNOSIS — R69 Illness, unspecified: Secondary | ICD-10-CM | POA: Diagnosis not present

## 2022-03-22 DIAGNOSIS — R69 Illness, unspecified: Secondary | ICD-10-CM | POA: Diagnosis not present

## 2022-03-22 DIAGNOSIS — F331 Major depressive disorder, recurrent, moderate: Secondary | ICD-10-CM | POA: Diagnosis not present

## 2022-03-29 DIAGNOSIS — R69 Illness, unspecified: Secondary | ICD-10-CM | POA: Diagnosis not present

## 2022-03-29 DIAGNOSIS — F331 Major depressive disorder, recurrent, moderate: Secondary | ICD-10-CM | POA: Diagnosis not present

## 2022-04-05 DIAGNOSIS — F331 Major depressive disorder, recurrent, moderate: Secondary | ICD-10-CM | POA: Diagnosis not present

## 2022-04-05 DIAGNOSIS — R69 Illness, unspecified: Secondary | ICD-10-CM | POA: Diagnosis not present

## 2022-04-12 DIAGNOSIS — R69 Illness, unspecified: Secondary | ICD-10-CM | POA: Diagnosis not present

## 2022-04-12 DIAGNOSIS — F331 Major depressive disorder, recurrent, moderate: Secondary | ICD-10-CM | POA: Diagnosis not present

## 2022-04-19 DIAGNOSIS — R69 Illness, unspecified: Secondary | ICD-10-CM | POA: Diagnosis not present

## 2022-04-19 DIAGNOSIS — F331 Major depressive disorder, recurrent, moderate: Secondary | ICD-10-CM | POA: Diagnosis not present

## 2022-04-22 DIAGNOSIS — Z131 Encounter for screening for diabetes mellitus: Secondary | ICD-10-CM | POA: Diagnosis not present

## 2022-04-22 DIAGNOSIS — Z13220 Encounter for screening for lipoid disorders: Secondary | ICD-10-CM | POA: Diagnosis not present

## 2022-04-22 DIAGNOSIS — Z130 Encounter for screening for diseases of the blood and blood-forming organs and certain disorders involving the immune mechanism: Secondary | ICD-10-CM | POA: Diagnosis not present

## 2022-04-26 DIAGNOSIS — R69 Illness, unspecified: Secondary | ICD-10-CM | POA: Diagnosis not present

## 2022-04-26 DIAGNOSIS — F331 Major depressive disorder, recurrent, moderate: Secondary | ICD-10-CM | POA: Diagnosis not present

## 2022-04-29 DIAGNOSIS — R69 Illness, unspecified: Secondary | ICD-10-CM | POA: Diagnosis not present

## 2022-04-29 DIAGNOSIS — Z6841 Body Mass Index (BMI) 40.0 and over, adult: Secondary | ICD-10-CM | POA: Diagnosis not present

## 2022-04-29 DIAGNOSIS — R7303 Prediabetes: Secondary | ICD-10-CM | POA: Diagnosis not present

## 2022-05-03 DIAGNOSIS — R69 Illness, unspecified: Secondary | ICD-10-CM | POA: Diagnosis not present

## 2022-05-03 DIAGNOSIS — F331 Major depressive disorder, recurrent, moderate: Secondary | ICD-10-CM | POA: Diagnosis not present

## 2022-05-10 DIAGNOSIS — F331 Major depressive disorder, recurrent, moderate: Secondary | ICD-10-CM | POA: Diagnosis not present

## 2022-05-10 DIAGNOSIS — R69 Illness, unspecified: Secondary | ICD-10-CM | POA: Diagnosis not present

## 2022-05-17 DIAGNOSIS — F331 Major depressive disorder, recurrent, moderate: Secondary | ICD-10-CM | POA: Diagnosis not present

## 2022-05-17 DIAGNOSIS — R69 Illness, unspecified: Secondary | ICD-10-CM | POA: Diagnosis not present

## 2022-05-24 DIAGNOSIS — R69 Illness, unspecified: Secondary | ICD-10-CM | POA: Diagnosis not present

## 2022-05-24 DIAGNOSIS — F331 Major depressive disorder, recurrent, moderate: Secondary | ICD-10-CM | POA: Diagnosis not present

## 2022-05-31 DIAGNOSIS — R69 Illness, unspecified: Secondary | ICD-10-CM | POA: Diagnosis not present

## 2022-05-31 DIAGNOSIS — F331 Major depressive disorder, recurrent, moderate: Secondary | ICD-10-CM | POA: Diagnosis not present

## 2022-06-07 DIAGNOSIS — R69 Illness, unspecified: Secondary | ICD-10-CM | POA: Diagnosis not present

## 2022-06-07 DIAGNOSIS — F331 Major depressive disorder, recurrent, moderate: Secondary | ICD-10-CM | POA: Diagnosis not present

## 2022-06-15 DIAGNOSIS — F411 Generalized anxiety disorder: Secondary | ICD-10-CM | POA: Diagnosis not present

## 2022-06-15 DIAGNOSIS — F331 Major depressive disorder, recurrent, moderate: Secondary | ICD-10-CM | POA: Diagnosis not present

## 2022-06-21 DIAGNOSIS — F331 Major depressive disorder, recurrent, moderate: Secondary | ICD-10-CM | POA: Diagnosis not present

## 2022-06-21 DIAGNOSIS — F411 Generalized anxiety disorder: Secondary | ICD-10-CM | POA: Diagnosis not present

## 2022-06-27 DIAGNOSIS — Z1239 Encounter for other screening for malignant neoplasm of breast: Secondary | ICD-10-CM | POA: Diagnosis not present

## 2022-06-27 DIAGNOSIS — Z1231 Encounter for screening mammogram for malignant neoplasm of breast: Secondary | ICD-10-CM | POA: Diagnosis not present

## 2022-06-28 DIAGNOSIS — F411 Generalized anxiety disorder: Secondary | ICD-10-CM | POA: Diagnosis not present

## 2022-06-28 DIAGNOSIS — F331 Major depressive disorder, recurrent, moderate: Secondary | ICD-10-CM | POA: Diagnosis not present

## 2022-07-05 DIAGNOSIS — F411 Generalized anxiety disorder: Secondary | ICD-10-CM | POA: Diagnosis not present

## 2022-07-05 DIAGNOSIS — F331 Major depressive disorder, recurrent, moderate: Secondary | ICD-10-CM | POA: Diagnosis not present

## 2022-07-12 DIAGNOSIS — F331 Major depressive disorder, recurrent, moderate: Secondary | ICD-10-CM | POA: Diagnosis not present

## 2022-07-12 DIAGNOSIS — F411 Generalized anxiety disorder: Secondary | ICD-10-CM | POA: Diagnosis not present

## 2022-07-19 DIAGNOSIS — F331 Major depressive disorder, recurrent, moderate: Secondary | ICD-10-CM | POA: Diagnosis not present

## 2022-07-19 DIAGNOSIS — F411 Generalized anxiety disorder: Secondary | ICD-10-CM | POA: Diagnosis not present

## 2023-05-11 DIAGNOSIS — Z1331 Encounter for screening for depression: Secondary | ICD-10-CM | POA: Diagnosis not present

## 2023-05-11 DIAGNOSIS — Z0000 Encounter for general adult medical examination without abnormal findings: Secondary | ICD-10-CM | POA: Diagnosis not present

## 2023-05-11 DIAGNOSIS — Z13220 Encounter for screening for lipoid disorders: Secondary | ICD-10-CM | POA: Diagnosis not present

## 2023-05-11 DIAGNOSIS — Z124 Encounter for screening for malignant neoplasm of cervix: Secondary | ICD-10-CM | POA: Diagnosis not present

## 2023-05-11 DIAGNOSIS — F419 Anxiety disorder, unspecified: Secondary | ICD-10-CM | POA: Diagnosis not present

## 2023-05-11 DIAGNOSIS — Z6841 Body Mass Index (BMI) 40.0 and over, adult: Secondary | ICD-10-CM | POA: Diagnosis not present

## 2023-05-11 DIAGNOSIS — Z1231 Encounter for screening mammogram for malignant neoplasm of breast: Secondary | ICD-10-CM | POA: Diagnosis not present

## 2023-05-11 DIAGNOSIS — Z130 Encounter for screening for diseases of the blood and blood-forming organs and certain disorders involving the immune mechanism: Secondary | ICD-10-CM | POA: Diagnosis not present

## 2023-05-11 DIAGNOSIS — R7303 Prediabetes: Secondary | ICD-10-CM | POA: Diagnosis not present

## 2023-05-15 ENCOUNTER — Other Ambulatory Visit: Payer: Self-pay | Admitting: Family Medicine

## 2023-05-15 DIAGNOSIS — Z1231 Encounter for screening mammogram for malignant neoplasm of breast: Secondary | ICD-10-CM
